# Patient Record
Sex: Female | Born: 1980 | Race: White | Hispanic: No | Marital: Married | Smoking: Never smoker
Health system: Southern US, Community
[De-identification: ages and names within clinical notes are randomized; demographics above are authoritative.]

## PROBLEM LIST (undated history)

## (undated) DIAGNOSIS — S73199A Other sprain of unspecified hip, initial encounter: Secondary | ICD-10-CM

## (undated) DIAGNOSIS — Z8041 Family history of malignant neoplasm of ovary: Secondary | ICD-10-CM

## (undated) HISTORY — PX: CHOLECYSTECTOMY: SHX55

---

## 1998-10-28 HISTORY — PX: CHOLECYSTECTOMY: SHX55

## 1998-11-01 ENCOUNTER — Ambulatory Visit (HOSPITAL_COMMUNITY): Admission: RE | Admit: 1998-11-01 | Discharge: 1998-11-01 | Payer: Self-pay | Admitting: General Surgery

## 2000-03-25 ENCOUNTER — Other Ambulatory Visit: Admission: RE | Admit: 2000-03-25 | Discharge: 2000-03-25 | Payer: Self-pay | Admitting: Obstetrics and Gynecology

## 2000-07-03 ENCOUNTER — Other Ambulatory Visit: Admission: RE | Admit: 2000-07-03 | Discharge: 2000-07-03 | Payer: Self-pay | Admitting: Obstetrics and Gynecology

## 2001-03-06 ENCOUNTER — Other Ambulatory Visit: Admission: RE | Admit: 2001-03-06 | Discharge: 2001-03-06 | Payer: Self-pay | Admitting: Obstetrics and Gynecology

## 2001-04-08 ENCOUNTER — Encounter: Payer: Self-pay | Admitting: Obstetrics and Gynecology

## 2001-04-08 ENCOUNTER — Ambulatory Visit (HOSPITAL_COMMUNITY): Admission: RE | Admit: 2001-04-08 | Discharge: 2001-04-08 | Payer: Self-pay | Admitting: Obstetrics and Gynecology

## 2001-06-15 ENCOUNTER — Inpatient Hospital Stay (HOSPITAL_COMMUNITY): Admission: AD | Admit: 2001-06-15 | Discharge: 2001-06-15 | Payer: Self-pay | Admitting: Obstetrics and Gynecology

## 2001-09-03 ENCOUNTER — Inpatient Hospital Stay (HOSPITAL_COMMUNITY): Admission: AD | Admit: 2001-09-03 | Discharge: 2001-09-05 | Payer: Self-pay | Admitting: Obstetrics and Gynecology

## 2001-09-23 ENCOUNTER — Encounter: Admission: RE | Admit: 2001-09-23 | Discharge: 2001-10-23 | Payer: Self-pay | Admitting: Obstetrics and Gynecology

## 2002-04-06 ENCOUNTER — Other Ambulatory Visit: Admission: RE | Admit: 2002-04-06 | Discharge: 2002-04-06 | Payer: Self-pay | Admitting: Obstetrics and Gynecology

## 2003-05-20 ENCOUNTER — Other Ambulatory Visit: Admission: RE | Admit: 2003-05-20 | Discharge: 2003-05-20 | Payer: Self-pay | Admitting: Obstetrics and Gynecology

## 2004-01-24 ENCOUNTER — Inpatient Hospital Stay (HOSPITAL_COMMUNITY): Admission: AD | Admit: 2004-01-24 | Discharge: 2004-01-24 | Payer: Self-pay | Admitting: Obstetrics and Gynecology

## 2004-04-23 ENCOUNTER — Inpatient Hospital Stay (HOSPITAL_COMMUNITY): Admission: AD | Admit: 2004-04-23 | Discharge: 2004-04-25 | Payer: Self-pay | Admitting: Obstetrics and Gynecology

## 2004-06-04 ENCOUNTER — Other Ambulatory Visit: Admission: RE | Admit: 2004-06-04 | Discharge: 2004-06-04 | Payer: Self-pay | Admitting: Obstetrics and Gynecology

## 2005-07-15 ENCOUNTER — Other Ambulatory Visit: Admission: RE | Admit: 2005-07-15 | Discharge: 2005-07-15 | Payer: Self-pay | Admitting: Obstetrics and Gynecology

## 2006-07-23 ENCOUNTER — Other Ambulatory Visit: Admission: RE | Admit: 2006-07-23 | Discharge: 2006-07-23 | Payer: Self-pay | Admitting: Obstetrics and Gynecology

## 2010-08-10 ENCOUNTER — Ambulatory Visit (HOSPITAL_COMMUNITY): Admission: RE | Admit: 2010-08-10 | Discharge: 2010-08-10 | Payer: Self-pay | Admitting: Obstetrics and Gynecology

## 2010-08-10 HISTORY — PX: HYSTEROSCOPY WITH NOVASURE: SHX5574

## 2011-01-09 LAB — CBC
HCT: 39 % (ref 36.0–46.0)
Hemoglobin: 13.3 g/dL (ref 12.0–15.0)
MCH: 29.1 pg (ref 26.0–34.0)
MCHC: 34.2 g/dL (ref 30.0–36.0)
MCV: 85 fL (ref 78.0–100.0)
Platelets: 223 10*3/uL (ref 150–400)
RBC: 4.59 MIL/uL (ref 3.87–5.11)
RDW: 12.8 % (ref 11.5–15.5)
WBC: 5.7 10*3/uL (ref 4.0–10.5)

## 2011-03-15 NOTE — Discharge Summary (Signed)
NAMEALDINE, CHAKRABORTY                          ACCOUNT NO.:  [id number]   MEDICAL RECORD NO.:  [id number]                   PATIENT TYPE:  INP   LOCATION:  9142                                 FACILITY:  [facility]   PHYSICIAN:  Huel Cote, M.D.              DATE OF BIRTH:  03-22-1981   DATE OF ADMISSION:  04/23/2004  DATE OF DISCHARGE:  04/25/2004                                 DISCHARGE SUMMARY   DISCHARGE DIAGNOSES:  1. Term pregnancy at 39+ weeks, delivered.  2. Status post normal spontaneous vaginal delivery.   DISCHARGE FOLLOWUP:  The patient is to follow up in the office in  approximately six weeks for her routine postpartum exam.   HISTORY OF PRESENT ILLNESS:  The patient is a 30 year old, G2, P1-0-0-1, who  was admitted at 39-5/[redacted] weeks gestation for induction of labor given term  status and a favorable cervix.  Prenatal care was uncomplicated.  Prenatal  laboratories were as follows:  B negative, antibody negative, RPR  nonreactive, rubella immune, hepatitis B surface antigen negative, HIV  declined, GC negative, chlamydia negative, triple screen normal, and group B  Streptococcus negative.   PAST OBSTETRICAL HISTORY:  In 2002, she had a vaginal delivery of a 7 pound  15 ounce infant.   PAST GYNECOLOGICAL HISTORY:  She had an ascus Pap smear in May of 2001.   PAST MEDICAL HISTORY:  None.   PAST SURGICAL HISTORY:  Cholecystectomy in 2000.   ALLERGIES:  None.   MEDICATIONS:  None.   PHYSICAL EXAMINATION:  VITAL SIGNS:  On admission, she was afebrile with  stable vital signs.  CARDIAC:  Regular rate and rhythm.  LUNGS:  Clear.  PELVIC:  The cervix was 70% effaced, 3+ cm, and a -1 station.   HOSPITAL COURSE:  She had rupture of membranes performed with clear fluid  obtained.  She was placed on Pitocin for low-dose protocol.  She continued  to progress well throughout the afternoon.  She reached complete dilation  and pushed with a normal spontaneous vaginal  delivery of a viable female  infant over a small first degree perineal laceration.  Apgars were 7 and 9.  Weight was 8 pounds 1 ounce.  The placenta was delivered  spontaneously.  There was a nuchal cord x 1 which was delivered through.  A  small laceration was repaired with 2-0 Vicryl.  She was then admitted for  routine postpartum care and did very well.  On postpartum day #2, she was  having minimal pain with Motrin and was felt stable or discharge home.                                               Huel Cote, M.D.    KR/MEDQ  D:  05/10/2004  T:  05/10/2004  Job:  [id number]

## 2011-03-15 NOTE — Discharge Summary (Signed)
[facility]  Patient:    Elizabeth Rose, Elizabeth Rose Visit Number: [id number] MRN: [id number]          Service Type: OBS Location: 910A 9140 01 Attending Physician:  Oliver Pila Dictated by:   Malachi Pro. Ambrose Mantle, M.D. Admit Date:  09/03/2001 Discharge Date: 09/05/2001                             Discharge Summary  HISTORY OF PRESENT ILLNESS:   This is a 30 year old, white female, P0, G1 at 39-5/[redacted] weeks gestation with [facility] of September 06, 2001, who presented with favorable cervix for induction of labor.  PRENATAL LABORATORY DATA:     Blood type B negative with negative antibody. RPR nonreactive. Rubella immune.  Hepatitis B surface antigen negative.  HIV negative.  GC and chlamydia negative.  Group B Streptococcus negative. One-hour glucola was 81.  PAST OBSTETRIC HISTORY:       Negative.  PAST GYNECOLOGIC HISTORY:     History of ASCUS on Pap smear in May 2001.  PAST SURGICAL HISTORY:        Cholecystectomy in 2000.  PAST MEDICAL HISTORY:         Negative.  FAMILY HISTORY:               Negative.  HOSPITAL COURSE:              The patient had normal vital signs on admission. Fetal heart tones reactive.  Cervix was 2-3 cm, 50% vertex and -2.  Artificial rupture of the membranes produced clear fluid.  By 12:45 p.m., the cervix was 3-4 cm, 70% vertex and -1.  By 5:40 p.m., the cervix was 4-5 cm.  Fetal heart tones were normal.  Intrauterine pressure catheter was placed to increase Pitocin.  By 10:30 p.m., the cervix was 8 cm.  Fetal heart tones showed early decelerations and excellent variability.  IUPC was replaced.  The patient reached complete dilatation and pushed well with a spontaneous vaginal delivery of a vigorous female infant over a first-degree perineal laceration by Dr. Senaida Ores.  The infant weighed 7 pounds 15 ounces.  Apgars were 9 and 9 at one and five minutes.  The placenta was delivered spontaneously.  There was mild uterine apnea  responded to manual massage and Pitocin.  Blood loss about 450 cc.  Postpartum, the patient did well and requested discharge on the first postpartum day.  The baby was negative, so the patient was not a candidate for RhoGAM.  Hemoglobin was 12.4, hematocrit 35.8, white count 8000, platelet count 233,000 and RPR nonreactive.  DISCHARGE DIAGNOSIS:          Intrauterine pregnancy at 39 weeks, delivered vertex.  PROCEDURE:                    Spontaneous vaginal delivery.  CONDITION ON DISCHARGE:       Improved.  SPECIAL INSTRUCTIONS:         Regular discharge instruction booklet.  Call with fever above 100 degrees.  Call with any unusual problems or heavy bleeding.  The patient declines analgesics at discharge.  FOLLOWUP:                     The patient is advised to return in six weeks for followup examination.  ACTIVITY:  No vaginal entrance, no heavy lifting. Dictated by:   Malachi Pro. Ambrose Mantle, M.D. Attending Physician:  Oliver Pila DD:  09/05/01 TD:  09/07/01 Job: [id number]

## 2012-11-13 ENCOUNTER — Other Ambulatory Visit: Payer: Self-pay | Admitting: Obstetrics and Gynecology

## 2012-11-13 DIAGNOSIS — N644 Mastodynia: Secondary | ICD-10-CM

## 2012-12-04 ENCOUNTER — Ambulatory Visit
Admission: RE | Admit: 2012-12-04 | Discharge: 2012-12-04 | Disposition: A | Discharge status: Home / Self Care | Payer: BC Managed Care – PPO | Source: Ambulatory Visit | Attending: Obstetrics and Gynecology | Admitting: Obstetrics and Gynecology

## 2012-12-04 DIAGNOSIS — N644 Mastodynia: Secondary | ICD-10-CM

## 2018-06-05 DIAGNOSIS — Z8041 Family history of malignant neoplasm of ovary: Secondary | ICD-10-CM | POA: Insufficient documentation

## 2019-03-11 ENCOUNTER — Ambulatory Visit (HOSPITAL_COMMUNITY)
Admission: EM | Admit: 2019-03-11 | Discharge: 2019-03-11 | Disposition: A | Discharge status: Home / Self Care | Payer: BLUE CROSS/BLUE SHIELD | Attending: Family Medicine | Admitting: Family Medicine

## 2019-03-11 ENCOUNTER — Ambulatory Visit (HOSPITAL_BASED_OUTPATIENT_CLINIC_OR_DEPARTMENT_OTHER)
Admit: 2019-03-11 | Discharge: 2019-03-11 | Disposition: A | Discharge status: Home / Self Care | Payer: BLUE CROSS/BLUE SHIELD | Attending: Family Medicine | Admitting: Family Medicine

## 2019-03-11 ENCOUNTER — Other Ambulatory Visit: Payer: Self-pay

## 2019-03-11 ENCOUNTER — Encounter (HOSPITAL_COMMUNITY): Payer: Self-pay

## 2019-03-11 DIAGNOSIS — M79661 Pain in right lower leg: Secondary | ICD-10-CM | POA: Diagnosis present

## 2019-03-11 DIAGNOSIS — M79609 Pain in unspecified limb: Secondary | ICD-10-CM

## 2019-03-11 NOTE — ED Triage Notes (Signed)
Pt present right leg/calf pain.  Pt states the pain started a few weeks ago.  Symptoms are tightness and cramping in her left lower calf.

## 2019-03-11 NOTE — Progress Notes (Signed)
RLE venous duplex negative for DVT       Preliminary results can be found under CV proc through chart review. Modean Mccullum, BS, RDMS, RVT   

## 2019-03-11 NOTE — ED Notes (Signed)
Called donna for appt.  Patient is scheduled at [facility] today at 2:00pm.

## 2019-03-11 NOTE — Discharge Instructions (Signed)
03/11/2019  2:00 PM  at  [facility]

## 2019-03-16 NOTE — ED Provider Notes (Signed)
[facility]   [id number] 03/11/19 Arrival Time: 1030  ASSESSMENT & PLAN:  1. Right calf pain    Discussed very low suspicion of this being a blood clot. She is aware but requests U/S.  I have personally viewed the imaging studies ordered this visit. U/S negative for DVT. Pt informed. May f/u with PCP as needed.  Reviewed expectations re: course of current medical issues. Questions answered. Outlined signs and symptoms indicating need for more acute intervention. Patient verbalized understanding. After Visit Summary given.  SUBJECTIVE: History from: patient. Elizabeth Rose is a 38 y.o. female who reports intermittent discomfort/pain of her right calf. Questions swelling at times. "Cramping feeling". No specific aggravating or alleviating factors reported. No injury/trauma reported. No CP/SOB reported. No h/o similar. She is very worried this could be a blood clot. Desires ultrasound. Ambulatory without difficulty. Unsure exactly when symptoms first noticed. No LMP recorded. Patient has had an ablation. No OCP use. Social History   Tobacco Use  Smoking Status Never Smoker  Smokeless Tobacco Never Used  No specific aggravating or alleviating factors reported. No home treatment. No extremity sensation changes or weakness.   History reviewed. No pertinent surgical history.   ROS: As per HPI. All other systems negative.    OBJECTIVE:  Vitals:   03/11/19 1056  BP: (!) 135/94  Pulse: 69  Resp: 16  Temp: 98.5 F (36.9 C)  TempSrc: Oral  SpO2: 100%    General appearance: alert; no distress HEENT: [place]; AT Neck: supple with FROM Lungs: CTAB CV: RRR Extremities: . RLE: warm and well perfused; poorly localized mild tenderness over right calf; without gross deformities; with without significant swelling when compared to her left calf; with no bruising; ROM: normal without reported discomfort CV: brisk extremity capillary refill of RLE; 2+ DP and PT pulse of RLE.  Skin: warm and dry; no visible rashes Neurologic: gait normal; normal reflexes of RLE and LLE; normal sensation of RLE and LLE; normal strength of RLE and LLE Psychological: alert and cooperative; normal mood and affect  No Known Allergies  PMH: No h/o blood clots.  Social History   Socioeconomic History  . Marital status: Married    Spouse name: Not on file  . Number of children: Not on file  . Years of education: Not on file  . Highest education level: Not on file  Occupational History  . Not on file  Social Needs  . Financial resource strain: Not on file  . Food insecurity:    Worry: Not on file    Inability: Not on file  . Transportation needs:    Medical: Not on file    Non-medical: Not on file  Tobacco Use  . Smoking status: Never Smoker  . Smokeless tobacco: Never Used  Substance and Sexual Activity  . Alcohol use: Not on file  . Drug use: Not on file  . Sexual activity: Not on file  Lifestyle  . Physical activity:    Days per week: Not on file    Minutes per session: Not on file  . Stress: Not on file  Relationships  . Social connections:    Talks on phone: Not on file    Gets together: Not on file    Attends religious service: Not on file    Active member of club or organization: Not on file    Attends meetings of clubs or organizations: Not on file    Relationship status: Not on file  Other Topics Concern  .  Not on file  Social History Narrative  . Not on file   Family History  Problem Relation Age of Onset  . Cancer Mother   . Heart failure Father    History reviewed. No pertinent surgical history.    Mardella LaymanHagler, Lola Lofaro, MD 03/24/19 [phone]

## 2020-04-24 ENCOUNTER — Ambulatory Visit: Payer: BC Managed Care – PPO

## 2020-05-11 ENCOUNTER — Ambulatory Visit: Payer: BC Managed Care – PPO | Attending: Internal Medicine

## 2020-05-11 DIAGNOSIS — Z23 Encounter for immunization: Secondary | ICD-10-CM

## 2020-05-11 NOTE — Progress Notes (Signed)
° °  Covid-19 Vaccination Clinic  Name:  Elizabeth Rose    MRN: [id number] DOB: [birth year]  05/11/2020  [name] was observed post Covid-19 immunization for 15 minutes without incident. She was provided with Vaccine Information Sheet and instruction to access the V-Safe system.   [name] was instructed to call 911 with any severe reactions post vaccine:  Difficulty breathing   Swelling of face and throat   A fast heartbeat   A bad rash all over body   Dizziness and weakness   Immunizations Administered    Name Date Dose VIS Date Route   Pfizer COVID-19 Vaccine 05/11/2020  4:32 PM 0.3 mL 12/22/2018 Intramuscular   Manufacturer: [employer], [employer]   Lot: [id number]   NDC: [address]-0

## 2021-06-02 ENCOUNTER — Encounter (HOSPITAL_COMMUNITY): Payer: Self-pay | Admitting: Emergency Medicine

## 2021-06-02 ENCOUNTER — Other Ambulatory Visit: Payer: Self-pay

## 2021-06-02 ENCOUNTER — Emergency Department (HOSPITAL_COMMUNITY): Payer: No Typology Code available for payment source

## 2021-06-02 ENCOUNTER — Observation Stay (HOSPITAL_COMMUNITY): Payer: No Typology Code available for payment source

## 2021-06-02 ENCOUNTER — Observation Stay (HOSPITAL_COMMUNITY)
Admission: EM | Admit: 2021-06-02 | Discharge: 2021-06-03 | Disposition: A | Discharge status: Home / Self Care | Payer: No Typology Code available for payment source | Attending: Family Medicine | Admitting: Family Medicine

## 2021-06-02 DIAGNOSIS — R945 Abnormal results of liver function studies: Secondary | ICD-10-CM | POA: Insufficient documentation

## 2021-06-02 DIAGNOSIS — U071 COVID-19: Principal | ICD-10-CM | POA: Diagnosis present

## 2021-06-02 DIAGNOSIS — R55 Syncope and collapse: Secondary | ICD-10-CM | POA: Insufficient documentation

## 2021-06-02 DIAGNOSIS — I951 Orthostatic hypotension: Secondary | ICD-10-CM | POA: Diagnosis not present

## 2021-06-02 DIAGNOSIS — R7989 Other specified abnormal findings of blood chemistry: Secondary | ICD-10-CM

## 2021-06-02 DIAGNOSIS — R059 Cough, unspecified: Secondary | ICD-10-CM | POA: Diagnosis present

## 2021-06-02 DIAGNOSIS — I959 Hypotension, unspecified: Secondary | ICD-10-CM | POA: Diagnosis present

## 2021-06-02 LAB — CBC WITH DIFFERENTIAL/PLATELET
Abs Immature Granulocytes: 0.02 10*3/uL (ref 0.00–0.07)
Basophils Absolute: 0 10*3/uL (ref 0.0–0.1)
Basophils Relative: 0 %
Eosinophils Absolute: 0 10*3/uL (ref 0.0–0.5)
Eosinophils Relative: 0 %
HCT: 42.7 % (ref 36.0–46.0)
Hemoglobin: 14 g/dL (ref 12.0–15.0)
Immature Granulocytes: 0 %
Lymphocytes Relative: 13 %
Lymphs Abs: 0.8 10*3/uL (ref 0.7–4.0)
MCH: 29.1 pg (ref 26.0–34.0)
MCHC: 32.8 g/dL (ref 30.0–36.0)
MCV: 88.8 fL (ref 80.0–100.0)
Monocytes Absolute: 0.8 10*3/uL (ref 0.1–1.0)
Monocytes Relative: 12 %
Neutro Abs: 4.8 10*3/uL (ref 1.7–7.7)
Neutrophils Relative %: 75 %
Platelets: 181 10*3/uL (ref 150–400)
RBC: 4.81 MIL/uL (ref 3.87–5.11)
RDW: 12.2 % (ref 11.5–15.5)
WBC: 6.4 10*3/uL (ref 4.0–10.5)
nRBC: 0 % (ref 0.0–0.2)

## 2021-06-02 LAB — COMPREHENSIVE METABOLIC PANEL
ALT: 85 U/L — ABNORMAL HIGH (ref 0–44)
AST: 117 U/L — ABNORMAL HIGH (ref 15–41)
Albumin: 4 g/dL (ref 3.5–5.0)
Alkaline Phosphatase: 73 U/L (ref 38–126)
Anion gap: 8 (ref 5–15)
BUN: 7 mg/dL (ref 6–20)
CO2: 26 mmol/L (ref 22–32)
Calcium: 8.6 mg/dL — ABNORMAL LOW (ref 8.9–10.3)
Chloride: 102 mmol/L (ref 98–111)
Creatinine, Ser: 0.85 mg/dL (ref 0.44–1.00)
GFR, Estimated: >60 mL/min (ref >60)
Glucose, Bld: 124 mg/dL — ABNORMAL HIGH (ref 70–99)
Potassium: 4.6 mmol/L (ref 3.5–5.1)
Sodium: 136 mmol/L (ref 135–145)
Total Bilirubin: 0.4 mg/dL (ref 0.3–1.2)
Total Protein: 6.9 g/dL (ref 6.5–8.1)

## 2021-06-02 LAB — URINALYSIS, ROUTINE W REFLEX MICROSCOPIC
Bilirubin Urine: NEGATIVE
Glucose, UA: NEGATIVE mg/dL
Hgb urine dipstick: NEGATIVE
Ketones, ur: 20 mg/dL — AB
Leukocytes,Ua: NEGATIVE
Nitrite: NEGATIVE
Protein, ur: NEGATIVE mg/dL
Specific Gravity, Urine: 1.013 (ref 1.005–1.030)
pH: 5 (ref 5.0–8.0)

## 2021-06-02 LAB — D-DIMER, QUANTITATIVE: D-Dimer, Quant: 0.27 ug/mL-FEU (ref 0.00–0.50)

## 2021-06-02 LAB — LACTIC ACID, PLASMA
Lactic Acid, Venous: 1.2 mmol/L (ref 0.5–1.9)
Lactic Acid, Venous: 1.3 mmol/L (ref 0.5–1.9)

## 2021-06-02 LAB — FERRITIN: Ferritin: 39 ng/mL (ref 11–307)

## 2021-06-02 LAB — CBG MONITORING, ED: Glucose-Capillary: 117 mg/dL — ABNORMAL HIGH (ref 70–99)

## 2021-06-02 LAB — C-REACTIVE PROTEIN: CRP: 0.6 mg/dL (ref <1.0)

## 2021-06-02 LAB — ACETAMINOPHEN LEVEL: Acetaminophen (Tylenol), Serum: <10 ug/mL — ABNORMAL LOW (ref 10–30)

## 2021-06-02 MED ORDER — MENTHOL 3 MG MT LOZG
1.0000 | LOZENGE | OROMUCOSAL | Status: DC | PRN
  Filled 2021-06-02: qty 9

## 2021-06-02 MED ORDER — ZINC SULFATE 220 (50 ZN) MG PO CAPS
220.0000 mg | ORAL_CAPSULE | Freq: Every day | ORAL | Status: DC
  Administered 2021-06-02 – 2021-06-03 (×2): 220 mg via ORAL
  Filled 2021-06-02 (×2): qty 1

## 2021-06-02 MED ORDER — SODIUM CHLORIDE 0.9 % IV BOLUS
1000.0000 mL | Freq: Once | INTRAVENOUS | Status: AC
  Administered 2021-06-02: 1000 mL via INTRAVENOUS

## 2021-06-02 MED ORDER — LACTATED RINGERS IV BOLUS
500.0000 mL | Freq: Once | INTRAVENOUS | Status: AC
  Administered 2021-06-02: 500 mL via INTRAVENOUS

## 2021-06-02 MED ORDER — ONDANSETRON HCL 4 MG/2ML IJ SOLN
4.0000 mg | Freq: Four times a day (QID) | INTRAMUSCULAR | Status: DC | PRN
  Administered 2021-06-02: 4 mg via INTRAVENOUS
  Filled 2021-06-02: qty 2

## 2021-06-02 MED ORDER — NIRMATRELVIR/RITONAVIR (PAXLOVID)TABLET
3.0000 | ORAL_TABLET | Freq: Two times a day (BID) | ORAL | Status: DC
  Administered 2021-06-02 – 2021-06-03 (×3): 3 via ORAL
  Filled 2021-06-02 (×2): qty 30

## 2021-06-02 MED ORDER — ASCORBIC ACID 500 MG PO TABS
500.0000 mg | ORAL_TABLET | Freq: Every day | ORAL | Status: DC
  Administered 2021-06-02 – 2021-06-03 (×2): 500 mg via ORAL
  Filled 2021-06-02 (×2): qty 1

## 2021-06-02 MED ORDER — ACETAMINOPHEN 325 MG PO TABS: 650.0000 mg | ORAL_TABLET | Freq: Once | ORAL | Status: DC

## 2021-06-02 MED ORDER — SODIUM CHLORIDE 0.9 % IV SOLN: INTRAVENOUS | Status: DC

## 2021-06-02 MED ORDER — ONDANSETRON HCL 4 MG PO TABS
4.0000 mg | ORAL_TABLET | Freq: Four times a day (QID) | ORAL | Status: DC | PRN
  Administered 2021-06-03: 4 mg via ORAL
  Filled 2021-06-02: qty 1

## 2021-06-02 MED ORDER — SODIUM CHLORIDE 0.9% FLUSH
3.0000 mL | Freq: Two times a day (BID) | INTRAVENOUS | Status: DC
  Administered 2021-06-02 – 2021-06-03 (×2): 3 mL via INTRAVENOUS

## 2021-06-02 MED ORDER — GUAIFENESIN-DM 100-10 MG/5ML PO SYRP: 10.0000 mL | ORAL_SOLUTION | ORAL | Status: DC | PRN

## 2021-06-02 MED ORDER — ENOXAPARIN SODIUM 40 MG/0.4ML IJ SOSY
40.0000 mg | PREFILLED_SYRINGE | INTRAMUSCULAR | Status: DC
  Administered 2021-06-02: 40 mg via SUBCUTANEOUS

## 2021-06-02 MED ORDER — HYDROCOD POLST-CPM POLST ER 10-8 MG/5ML PO SUER
5.0000 mL | Freq: Two times a day (BID) | ORAL | Status: DC | PRN
Start: 2021-06-02 — End: 2021-06-03
  Administered 2021-06-02: 5 mL via ORAL
  Filled 2021-06-02: qty 5

## 2021-06-02 NOTE — ED Notes (Signed)
PT unable to due ortho vitals at this time due to n/v

## 2021-06-02 NOTE — ED Provider Notes (Signed)
[facility]-EMERGENCY DEPT Provider Note   CSN: [id number] Arrival date & time: 06/02/21  0443     History Chief Complaint  Patient presents with   Covid Positive    Elizabeth Rose is a 40 y.o. female.  Patient presents to the emergency department with a chief complaint of syncope.  She states that she took a home COVID test which was positive earlier yesterday.  States that she stood up to take some medicine this morning, and passed out.  She complains of sore throat and slight cough.  She denies chest pain or shortness of breath.  She is noted to be hypotensive and bradycardic and hypothermic in triage.  The history is provided by the patient. No language interpreter was used.      History reviewed. No pertinent past medical history.  There are no problems to display for this patient.   History reviewed. No pertinent surgical history.   OB History   No obstetric history on file.     Family History  Problem Relation Age of Onset   Cancer Mother    Heart failure Father     Social History   Tobacco Use   Smoking status: Never   Smokeless tobacco: Never    Home Medications Prior to Admission medications   Not on File    Allergies    Patient has no known allergies.  Review of Systems   Review of Systems  All other systems reviewed and are negative.  Physical Exam Updated Vital Signs BP (!) 89/51 (BP Location: Left Arm)   Pulse (!) 46   Temp (!) 94.5 F (34.7 C) (Axillary)   Resp 20   SpO2 97%   Physical Exam Vitals and nursing note reviewed.  Constitutional:      General: She is not in acute distress.    Appearance: She is well-developed.  HENT:     Head: Normocephalic and atraumatic.  Eyes:     Conjunctiva/sclera: Conjunctivae normal.  Cardiovascular:     Rate and Rhythm: Normal rate and regular rhythm.     Heart sounds: No murmur heard. Pulmonary:     Effort: Pulmonary effort is normal. No respiratory distress.      Breath sounds: Normal breath sounds.  Abdominal:     Palpations: Abdomen is soft.     Tenderness: There is no abdominal tenderness.  Musculoskeletal:        General: Normal range of motion.     Cervical back: Neck supple.  Skin:    General: Skin is warm and dry.  Neurological:     Mental Status: She is alert and oriented to person, place, and time.  Psychiatric:     Comments: flat    ED Results / Procedures / Treatments   Labs (all labs ordered are listed, but only abnormal results are displayed) Labs Reviewed  CBC WITH DIFFERENTIAL/PLATELET  COMPREHENSIVE METABOLIC PANEL  URINALYSIS, ROUTINE W REFLEX MICROSCOPIC    EKG None  Radiology No results found.  Procedures Procedures   Medications Ordered in ED Medications  sodium chloride 0.9 % bolus 1,000 mL (has no administration in time range)  sodium chloride 0.9 % bolus 1,000 mL (has no administration in time range)    ED Course  I have reviewed the triage vital signs and the nursing notes.  Pertinent labs & imaging results that were available during my care of the patient were reviewed by me and considered in my medical decision making (see chart for details).  MDM Rules/Calculators/A&P                           Elizabeth Rose was evaluated in Emergency Department on 06/02/2021 for the symptoms described in the history of present illness. She was evaluated in the context of the global COVID-19 pandemic, which necessitated consideration that the patient might be at risk for infection with the SARS-CoV-2 virus that causes COVID-19. Institutional protocols and algorithms that pertain to the evaluation of patients at risk for COVID-19 are in a state of rapid change based on information released by regulatory bodies including the CDC and federal and state organizations. These policies and algorithms were followed during the patient's care in the ED.  Patient had axillary temperature of 94.5 in triage, rectal temperature  was checked and is 98.7.  Patient noted to be hypotensive at 89/51 with a MAP of 63 in triage.  Will give fluids.  Patient also noted to be bradycardic at 46.  EKG shows sinus bradycardia at 53 bpm with sinus arrhythmia, otherwise no abnormalities seen.  Chest x-ray shows NO infiltrate.  CT head also ordered because of the fall.  CT shows no intracranial bleeding or abnormality.  Will give patient fluids and will reassess and check labs.  If blood pressures improve and patient stabilizes, anticipate the patient can be discharged home with an antiviral.  Patient signed out at shift change to oncoming team, who will continue care.  Plan: F/u on labs Reassess after fluids  Final Clinical Impression(s) / ED Diagnoses Final diagnoses:  COVID-19  Syncope, unspecified syncope type  Hypotension, unspecified hypotension type    Rx / DC Orders ED Discharge Orders     None        Roxy Horseman, PA-C 06/02/21 5625    Jacalyn Lefevre, MD 06/02/21 [phone]

## 2021-06-02 NOTE — Plan of Care (Signed)

## 2021-06-02 NOTE — H&P (Signed)
History and Physical    Mareesa Loeffelholz YBO:[id number] DOB: December 29, 1980 DOA: 06/02/2021  PCP: Patient, No Pcp Per (Inactive)  Patient coming from: Home  Chief Complaint: syncope  HPI: Elizabeth Rose is a 40 y.o. female with no significant past medical history. Presenting with a syncopal episode. History is from husband at bedside. He reports that at 4am this morning, he heard a "thump" in the kitchen. He found her on passed out on the kitchen floor. He was able to sit her up but she still seemed out of it. There was no tonic-clonic shaking or evidence of incontinence. He tried to give her sprite, but she passed out again. When she came to, she complained of being hot/flush and she appeared pale. He decided to bring her to the ED for help.  Of note, she's been suffering from poor PO intake for several days. She had a sore throat and body aches for several days w/ cough She had transient fever. She has tried APAP, ibuprofen, nyquil, advil cold & sinus, and hydrocodone. Nothing has helped. She has had no sick contacts. She denies any other aggravating or alleviating factors.    ED Course: She was found to be COVID+. She was found to be bradycardic and hypotensive. UA was unremarkable. She was started on fluids and paxlovid. TRH was called for admission.   Review of Systems:  Denies CP, dyspnea, palpitations, N/V/D. Review of systems is otherwise negative for all not mentioned in HPI.   PMHx Denies PMHx  PSHx Cholecystectomy  SocHx  reports that she has never smoked. She has never used smokeless tobacco. No history on file for alcohol use and drug use.  No Known Allergies  FamHx Family History  Problem Relation Age of Onset   Cancer Mother    Heart failure Father     Prior to Admission medications   Medication Sig Start Date End Date Taking? Authorizing Provider  lidocaine (XYLOCAINE) 2 % solution Use as directed 15 mLs in the mouth or throat See admin instructions. Use as directed 15  mLs in the mouth or throat every 3 hours 06/01/21  Yes [provider]    Physical Exam: Vitals:   06/02/21 1100 06/02/21 1115 06/02/21 1200 06/02/21 1215  BP: 106/67 113/63 105/71   Pulse: (!) 54 68 (!) 52 61  Resp: 15 11 14 12   Temp:      TempSrc:      SpO2: 99% 100% 99% 99%  Weight:      Height:        General: 40 y.o. female resting in bed in NAD Eyes: PERRL, normal sclera ENMT: Nares patent w/o discharge, orophaynx clear, dentition normal, ears w/o discharge/lesions/ulcers Neck: Supple, trachea midline Cardiovascular: brady, +S1, S2, no m/g/r, equal pulses throughout Respiratory: CTABL, no w/r/r, normal WOB GI: BS+, NDNT, no masses noted, no organomegaly noted MSK: No e/c/c Skin: No rashes, bruises, ulcerations noted Neuro: A&O x 3, no focal deficits Psyc: Appropriate interaction and affect, calm/cooperative  Labs on Admission: I have personally reviewed following labs and imaging studies  CBC: Recent Labs  Lab 06/02/21 0611  WBC 6.4  NEUTROABS 4.8  HGB 14.0  HCT 42.7  MCV 88.8  PLT 181   Basic Metabolic Panel: Recent Labs  Lab 06/02/21 0611  NA 136  K 4.6  CL 102  CO2 26  GLUCOSE 124*  BUN 7  CREATININE 0.85  CALCIUM 8.6*   GFR: Estimated Creatinine Clearance: 76.1 mL/min (by C-G formula based on SCr of  0.85 mg/dL). Liver Function Tests: Recent Labs  Lab 06/02/21 0611  AST 117*  ALT 85*  ALKPHOS 73  BILITOT 0.4  PROT 6.9  ALBUMIN 4.0   No results for input(s): LIPASE, AMYLASE in the last 168 hours. No results for input(s): AMMONIA in the last 168 hours. Coagulation Profile: No results for input(s): INR, PROTIME in the last 168 hours. Cardiac Enzymes: No results for input(s): CKTOTAL, CKMB, CKMBINDEX, TROPONINI in the last 168 hours. BNP (last 3 results) No results for input(s): PROBNP in the last 8760 hours. HbA1C: No results for input(s): HGBA1C in the last 72 hours. CBG: Recent Labs  Lab 06/02/21 0623  GLUCAP 117*    Lipid Profile: No results for input(s): CHOL, HDL, LDLCALC, TRIG, CHOLHDL, LDLDIRECT in the last 72 hours. Thyroid Function Tests: No results for input(s): TSH, T4TOTAL, FREET4, T3FREE, THYROIDAB in the last 72 hours. Anemia Panel: No results for input(s): VITAMINB12, FOLATE, FERRITIN, TIBC, IRON, RETICCTPCT in the last 72 hours. Urine analysis:    Component Value Date/Time   COLORURINE YELLOW 06/02/2021 1026   APPEARANCEUR CLEAR 06/02/2021 1026   LABSPEC 1.013 06/02/2021 1026   PHURINE 5.0 06/02/2021 1026   GLUCOSEU NEGATIVE 06/02/2021 1026   HGBUR NEGATIVE 06/02/2021 1026   BILIRUBINUR NEGATIVE 06/02/2021 1026   KETONESUR 20 (A) 06/02/2021 1026   PROTEINUR NEGATIVE 06/02/2021 1026   NITRITE NEGATIVE 06/02/2021 1026   LEUKOCYTESUR NEGATIVE 06/02/2021 1026    Radiological Exams on Admission: DG Chest 2 View  Result Date: 06/02/2021 CLINICAL DATA:  40 year old female with history of cough and syncope. EXAM: CHEST - 2 VIEW COMPARISON:  No priors. FINDINGS: Lung volumes are normal. No consolidative airspace disease. No pleural effusions. No pneumothorax. No pulmonary nodule or mass noted. Pulmonary vasculature and the cardiomediastinal silhouette are within normal limits. IMPRESSION: No radiographic evidence of acute cardiopulmonary disease. Electronically Signed   By: Trudie Reed M.D.   On: 06/02/2021 06:07   CT HEAD WO CONTRAST ( )  Result Date: 06/02/2021 CLINICAL DATA:  40 year old female with history of facial trauma. Hypotension. Bradycardia. EXAM: CT HEAD WITHOUT CONTRAST TECHNIQUE: Contiguous axial images were obtained from the base of the skull through the vertex without intravenous contrast. COMPARISON:  No priors. FINDINGS: Brain: No evidence of acute infarction, hemorrhage, hydrocephalus, extra-axial collection or mass lesion/mass effect. Vascular: No hyperdense vessel or unexpected calcification. Skull: Normal. Negative for fracture or focal lesion. Sinuses/Orbits: No  acute finding. Other: None. IMPRESSION: 1. No acute intracranial abnormalities. The appearance of the brain is normal. Electronically Signed   By: Trudie Reed M.D.   On: 06/02/2021 06:07    EKG: Independently reviewed. Sinus brady, no st elevation  Assessment/Plan Syncope     - place in obs, tele     - check orthostatics     - continue fluids for now     - d-dimer is negative     - check echo     - CTH and CXR are negative     - story doesn't sound like seizure, so will hold off on EEG for now     - could be secondary to bradycardia or COVID infection; let's treat COVID, monitor her on tele, if she doesn't improve, get cards consult for bradycardia  COVID 19     - no O2 requirement     - continue paxlovid, fluids; add anti-tussives     - follow inflammatory markers     - place in isolation     - she's had  2-part vaccine, but no booster  Bradycardia     - EKG w/ sinus brady; no sign of HB     - she's running HR between 40 - 70 during interview     - let's keep her on tele; treat COVID, if not improving, can speak with cards about her heart     - she will get an echo as part of the syncope w/u  Elevated LFTs     - check hepatitis panel and RUQ US  DVT prophylaxis: lovenox  Code Status: FULL  Family Communication: w/ husband at bedside  Consults called: None   Status is: Observation  The patient remains OBS appropriate and will d/c before 2 midnights.  Dispo: The patient is from: Home              Anticipated d/c is to: Home              Patient currently is not medically stable to d/c.   Difficult to place patient No  Time spent coordinating admission: 60 minutes  Katoya Amato A Faten Frieson DO [facility]  If 7PM-7AM, please contact night-coverage [email or web address]  06/02/2021, 12:55 PM

## 2021-06-02 NOTE — Discharge Instructions (Signed)
   Please continue isolation at home, for at least 5 days since the start of your symptoms and until you have had 24 hours with no fever (without taking a fever reducer) and with improving of symptoms.  If you have no symptoms but tested positive (or all symptoms resolve after 5 days and you have no fever) you can leave your house but continue to wear a mask around others for an additional 5 days. If you have a fever,continue to stay home until you have had 24 hours of no fever. Most cases improve 5-10 days from onset but we have seen a small number of patients who have gotten worse after the 10 days.  Please be sure to watch for worsening symptoms and remain taking the proper precautions.   Go to the nearest hospital ED for assessment if fever/cough/breathlessness are severe or illness seems like a threat to life.    The following symptoms may appear 2-14 days after exposure: Fever Cough Shortness of breath or difficulty breathing Chills Repeated shaking with chills Muscle pain Headache Sore throat New loss of taste or smell Fatigue Congestion or runny nose Nausea or vomiting Diarrhea  You may also take acetaminophen (Tylenol) as needed for fever.  HOME CARE: Only take medications as instructed by your medical team. Drink plenty of fluids and get plenty of rest. A steam or ultrasonic humidifier can help if you have congestion.   GET HELP RIGHT AWAY IF YOU HAVE EMERGENCY WARNING SIGNS.  Call 911 or proceed to your closest emergency facility if: You develop worsening high fever. Trouble breathing Bluish lips or face Persistent pain or pressure in the chest New confusion Inability to wake or stay awake You cough up blood. Your symptoms become more severe Inability to hold down food or fluids  This list is not all possible symptoms. Contact your medical provider for any symptoms that are severe or concerning to you.

## 2021-06-02 NOTE — ED Triage Notes (Signed)
Pt not answering questions in triage, but per husband and mother, patient was dx'd with COVID yesterday and has been getting progressively weaker. Hypotensive and bradycardic in triage.   Husband: Tawanna Cooler [phone]

## 2021-06-02 NOTE — ED Provider Notes (Signed)
  Physical Exam  BP 101/62   Pulse (!) 53   Temp 98.7 F (37.1 C) (Rectal)   Resp 16   Ht 5\' 1"  (1.549 m)   Wt 68 kg   SpO2 94%   BMI 28.34 kg/m   Physical Exam  ED Course/Procedures   Clinical Course as of 06/02/21 1304  Sat Jun 02, 2021  1243 Consult to hospitalist, Dr. Jun 04, 2021, who is agreeable to admitting this patient to his service.  Appreciate his collaboration in the care of this patient. [RS]    Clinical Course User Index [RS] Ronaldo Miyamoto    EKG Interpretation  Date/Time:  Saturday June 02 2021 05:22:38 EDT Ventricular Rate:  53 PR Interval:  136 QRS Duration: 78 QT Interval:  462 QTC Calculation: 433 R Axis:   68 Text Interpretation: Sinus bradycardia with sinus arrhythmia Otherwise normal ECG No old tracing to compare Confirmed by [id number] [phone]) on 06/02/2021 5:38:36 AM        Procedures  MDM   Care of this patient assumed from preceding ED provider 08/02/2021, PA-C at time of shift change.  In brief patient tested positive for COVID-19 yesterday at [facility] and presents after syncopal episode  this morning.  Concern for progressive weakness.  Patient was hypotensive and bradycardic in triage.  At time of shift change she is awaiting results for the majority of her laboratory studies.  She is also receiving first liter bolus of IV fluids.  Pending labs and improved BP and ambulation status following fluid resuscitation, may be discharged home.  However if she continues to be unable to ambulate or remains hypotensive may require admission for further stabilization.  CT head negative for acute intracranial abnormality, chest x-ray negative for acute cardiopulmonary disease.  EKG reassuring, sinus bradycardia with sinus arrhythmia with heart rate in the 50s.  CBC unremarkable, CMP with transaminitis with AST/ALT 117/85.  Total bilirubin is normal.  Lactic acid is normal, 1.3.    Patient reevaluated after administration of IV fluid  resuscitation.  Improvement in BP to systolic of 107, heart rate remains in the mid to high 50s.  Patient still appears ill however states she no longer feels near syncopal.  Further discussion regarding over-the-counter medications of the patient has been taking given findings of transaminitis.  Does appear patient is been taking significant amounts of Tylenol as well as hydrocodone mixed with Tylenol and over-the-counter cold medications containing Tylenol.  We will add on acetaminophen level to evaluate for acetaminophen toxicity and will attempt to ambulate the patient.  We will have low threshold to admit given patient remains ill-appearing, bradycardic, and borderline hypotensive after 2 liters of IV fluids.  Patient has received another liter of IV fluids remains borderline hypotensive.  Unable to ambulate independently.  Acetaminophen level is normal.  Given her poor functional status at this juncture do feel she would benefit from admission to the hospital for dehydration and weakness.  She is on any medications to contraindicate administration of packed fluid, will proceed with this treatment for her COVID-19 infection.  Consult to hospitalist as above. Loucille voiced understanding of her medical evaluation and treatment plan thus far.  Each of her questions was answered to her expressed satisfaction.  Return precautions given.  Patient is well-appearing, stable, appropriate for discharge at this time.    [facility], PA-C 06/02/21 1305    08/02/21, MD 06/02/21 [phone]

## 2021-06-02 NOTE — ED Notes (Signed)
Pt expressed dizziness with standing, pt's SpO2 levels maintained 100% Room Air with ambulation, pt required minimal assistance and maintained equal gait.

## 2021-06-02 NOTE — ED Notes (Signed)
Pt provided w/bedside commode. [employer]

## 2021-06-03 ENCOUNTER — Observation Stay (HOSPITAL_BASED_OUTPATIENT_CLINIC_OR_DEPARTMENT_OTHER): Payer: No Typology Code available for payment source

## 2021-06-03 DIAGNOSIS — R55 Syncope and collapse: Secondary | ICD-10-CM

## 2021-06-03 DIAGNOSIS — I951 Orthostatic hypotension: Secondary | ICD-10-CM | POA: Diagnosis present

## 2021-06-03 DIAGNOSIS — R7989 Other specified abnormal findings of blood chemistry: Secondary | ICD-10-CM | POA: Diagnosis present

## 2021-06-03 DIAGNOSIS — I959 Hypotension, unspecified: Secondary | ICD-10-CM | POA: Diagnosis present

## 2021-06-03 LAB — COMPREHENSIVE METABOLIC PANEL
ALT: 75 U/L — ABNORMAL HIGH (ref 0–44)
AST: 56 U/L — ABNORMAL HIGH (ref 15–41)
Albumin: 3.1 g/dL — ABNORMAL LOW (ref 3.5–5.0)
Alkaline Phosphatase: 57 U/L (ref 38–126)
Anion gap: 6 (ref 5–15)
BUN: 6 mg/dL (ref 6–20)
CO2: 24 mmol/L (ref 22–32)
Calcium: 7.7 mg/dL — ABNORMAL LOW (ref 8.9–10.3)
Chloride: 107 mmol/L (ref 98–111)
Creatinine, Ser: 0.61 mg/dL (ref 0.44–1.00)
GFR, Estimated: >60 mL/min (ref >60)
Glucose, Bld: 81 mg/dL (ref 70–99)
Potassium: 3.7 mmol/L (ref 3.5–5.1)
Sodium: 137 mmol/L (ref 135–145)
Total Bilirubin: 0.5 mg/dL (ref 0.3–1.2)
Total Protein: 5.7 g/dL — ABNORMAL LOW (ref 6.5–8.1)

## 2021-06-03 LAB — CBC WITH DIFFERENTIAL/PLATELET
Abs Immature Granulocytes: 0.01 10*3/uL (ref 0.00–0.07)
Basophils Absolute: 0 10*3/uL (ref 0.0–0.1)
Basophils Relative: 0 %
Eosinophils Absolute: 0 10*3/uL (ref 0.0–0.5)
Eosinophils Relative: 0 %
HCT: 36.3 % (ref 36.0–46.0)
Hemoglobin: 11.9 g/dL — ABNORMAL LOW (ref 12.0–15.0)
Immature Granulocytes: 0 %
Lymphocytes Relative: 34 %
Lymphs Abs: 1.4 10*3/uL (ref 0.7–4.0)
MCH: 28.7 pg (ref 26.0–34.0)
MCHC: 32.8 g/dL (ref 30.0–36.0)
MCV: 87.7 fL (ref 80.0–100.0)
Monocytes Absolute: 0.7 10*3/uL (ref 0.1–1.0)
Monocytes Relative: 17 %
Neutro Abs: 1.9 10*3/uL (ref 1.7–7.7)
Neutrophils Relative %: 49 %
Platelets: 178 10*3/uL (ref 150–400)
RBC: 4.14 MIL/uL (ref 3.87–5.11)
RDW: 12.1 % (ref 11.5–15.5)
WBC: 4 10*3/uL (ref 4.0–10.5)
nRBC: 0 % (ref 0.0–0.2)

## 2021-06-03 LAB — ECHOCARDIOGRAM COMPLETE
AR max vel: 2.18 cm2
AV Area VTI: 1.99 cm2
AV Area mean vel: 1.96 cm2
AV Mean grad: 3 mmHg
AV Peak grad: 6.4 mmHg
Ao pk vel: 1.26 m/s
Area-P 1/2: 3.77 cm2
Calc EF: 64 %
Height: 61 in
S' Lateral: 2.9 cm
Single Plane A2C EF: 66.2 %
Single Plane A4C EF: 62.1 %
Weight: 2398.6 oz

## 2021-06-03 LAB — D-DIMER, QUANTITATIVE: D-Dimer, Quant: <0.27 ug/mL-FEU (ref 0.00–0.50)

## 2021-06-03 LAB — HEPATITIS PANEL, ACUTE
HCV Ab: NONREACTIVE
Hep A IgM: NONREACTIVE
Hep B C IgM: NONREACTIVE
Hepatitis B Surface Ag: NONREACTIVE

## 2021-06-03 LAB — GLUCOSE, CAPILLARY: Glucose-Capillary: 84 mg/dL (ref 70–99)

## 2021-06-03 LAB — FERRITIN: Ferritin: 36 ng/mL (ref 11–307)

## 2021-06-03 LAB — HIV ANTIBODY (ROUTINE TESTING W REFLEX): HIV Screen 4th Generation wRfx: NONREACTIVE

## 2021-06-03 LAB — C-REACTIVE PROTEIN: CRP: 0.6 mg/dL (ref <1.0)

## 2021-06-03 MED ORDER — NIRMATRELVIR/RITONAVIR (PAXLOVID)TABLET: 3.0000 | ORAL_TABLET | Freq: Two times a day (BID) | ORAL | 0 refills | Status: AC
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
## 2021-06-03 NOTE — Progress Notes (Signed)
*  PRELIMINARY RESULTS* Echocardiogram 2D Echocardiogram has been performed.  Neomia Dear RDCS 06/03/2021, 8:30 AM

## 2021-06-03 NOTE — Progress Notes (Signed)
AVS given to patient and explained at the bedside. Medications and follow up appointments have been explained with pt verbalizing understanding.  

## 2021-08-07 ENCOUNTER — Other Ambulatory Visit (HOSPITAL_BASED_OUTPATIENT_CLINIC_OR_DEPARTMENT_OTHER): Payer: Self-pay

## 2021-08-07 MED ORDER — INFLUENZA VAC SPLIT QUAD 0.5 ML IM SUSY
PREFILLED_SYRINGE | INTRAMUSCULAR | 0 refills | Status: DC
  Filled 2021-08-07: qty 0.5, 1d supply, fill #0

## 2021-11-02 ENCOUNTER — Encounter: Payer: Self-pay | Admitting: Internal Medicine

## 2021-11-02 ENCOUNTER — Other Ambulatory Visit: Payer: Self-pay

## 2021-11-02 ENCOUNTER — Ambulatory Visit: Payer: No Typology Code available for payment source | Admitting: Internal Medicine

## 2021-11-02 ENCOUNTER — Ambulatory Visit (INDEPENDENT_AMBULATORY_CARE_PROVIDER_SITE_OTHER): Payer: No Typology Code available for payment source

## 2021-11-02 VITALS — BP 122/70 | HR 70 | Resp 18 | Ht 62.5 in | Wt 159.2 lb

## 2021-11-02 DIAGNOSIS — R7989 Other specified abnormal findings of blood chemistry: Secondary | ICD-10-CM

## 2021-11-02 DIAGNOSIS — M25551 Pain in right hip: Secondary | ICD-10-CM

## 2021-11-02 DIAGNOSIS — Z0001 Encounter for general adult medical examination with abnormal findings: Secondary | ICD-10-CM | POA: Diagnosis not present

## 2021-11-02 LAB — LIPID PANEL
Cholesterol: 142 mg/dL (ref 0–200)
HDL: 57 mg/dL (ref >39.00)
LDL Cholesterol: 72 mg/dL (ref 0–99)
NonHDL: 84.84
Total CHOL/HDL Ratio: 2
Triglycerides: 63 mg/dL (ref 0.0–149.0)
VLDL: 12.6 mg/dL (ref 0.0–40.0)

## 2021-11-02 LAB — COMPREHENSIVE METABOLIC PANEL
ALT: 16 U/L (ref 0–35)
AST: 19 U/L (ref 0–37)
Albumin: 4.4 g/dL (ref 3.5–5.2)
Alkaline Phosphatase: 67 U/L (ref 39–117)
BUN: 9 mg/dL (ref 6–23)
CO2: 29 mEq/L (ref 19–32)
Calcium: 9.2 mg/dL (ref 8.4–10.5)
Chloride: 102 mEq/L (ref 96–112)
Creatinine, Ser: 0.86 mg/dL (ref 0.40–1.20)
GFR: 83.02 mL/min (ref >60.00)
Glucose, Bld: 85 mg/dL (ref 70–99)
Potassium: 4.8 mEq/L (ref 3.5–5.1)
Sodium: 138 mEq/L (ref 135–145)
Total Bilirubin: 0.5 mg/dL (ref 0.2–1.2)
Total Protein: 7.2 g/dL (ref 6.0–8.3)

## 2021-11-02 LAB — CBC
HCT: 40.3 % (ref 36.0–46.0)
Hemoglobin: 13.5 g/dL (ref 12.0–15.0)
MCHC: 33.6 g/dL (ref 30.0–36.0)
MCV: 84.7 fl (ref 78.0–100.0)
Platelets: 258 10*3/uL (ref 150.0–400.0)
RBC: 4.76 Mil/uL (ref 3.87–5.11)
RDW: 12.8 % (ref 11.5–15.5)
WBC: 5.7 10*3/uL (ref 4.0–10.5)

## 2021-11-02 LAB — TSH: TSH: 1.85 u[IU]/mL (ref 0.35–5.50)

## 2021-11-02 LAB — VITAMIN D 25 HYDROXY (VIT D DEFICIENCY, FRACTURES): VITD: 49.06 ng/mL (ref 30.00–100.00)

## 2021-11-02 LAB — FOLLICLE STIMULATING HORMONE: FSH: 13.1 m[IU]/mL

## 2021-11-02 NOTE — Patient Instructions (Signed)
We will check the x-ray today. ?

## 2021-11-02 NOTE — Progress Notes (Signed)
° °  Subjective:   Patient ID: Elizabeth Rose, female    DOB: [birth year], 41 y.o.   MRN: [id number]  HPI The patient is a new 41 YO female coming in for concerns right hip pain/instability randomly for a few months at least  Desires physical as well.  PMH, [facility], social history reviewed and updated  Review of Systems  Constitutional: Negative.   HENT: Negative.    Eyes: Negative.   Respiratory:  Negative for cough, chest tightness and shortness of breath.   Cardiovascular:  Negative for chest pain, palpitations and leg swelling.  Gastrointestinal:  Negative for abdominal distention, abdominal pain, constipation, diarrhea, nausea and vomiting.  Musculoskeletal:  Positive for arthralgias.  Skin: Negative.   Neurological: Negative.   Psychiatric/Behavioral: Negative.     Objective:  Physical Exam Constitutional:      Appearance: She is well-developed.  HENT:     Head: Normocephalic and atraumatic.  Cardiovascular:     Rate and Rhythm: Normal rate and regular rhythm.  Pulmonary:     Effort: Pulmonary effort is normal. No respiratory distress.     Breath sounds: Normal breath sounds. No wheezing or rales.  Abdominal:     General: Bowel sounds are normal. There is no distension.     Palpations: Abdomen is soft.     Tenderness: There is no abdominal tenderness. There is no rebound.  Musculoskeletal:     Cervical back: Normal range of motion.  Skin:    General: Skin is warm and dry.  Neurological:     Mental Status: She is alert and oriented to person, place, and time.     Coordination: Coordination normal.   Vitals:   11/02/21 0758  BP: 122/70  Pulse: 70  Resp: 18  SpO2: 97%  Weight: 159 lb 3.2 oz (72.2 kg)  Height: 5' 2.5" (1.588 m)   This visit occurred during the SARS-CoV-2 public health emergency.  Safety protocols were in place, including screening questions prior to the visit, additional usage of staff PPE, and extensive cleaning of exam room while observing  appropriate contact time as indicated for disinfecting solutions.   Assessment & Plan:

## 2021-11-02 NOTE — Assessment & Plan Note (Signed)
Getting right x-ray for concerns about random pain and instability.

## 2021-11-02 NOTE — Assessment & Plan Note (Signed)
Flu shot up to date. Covid-19 booster encouraged. Tetanus declines today. Mammogram up to date with gyn, pap smear up to date with gyn. Counseled about sun safety and mole surveillance. Counseled about the dangers of distracted driving. Given 10 year screening recommendations.

## 2021-11-02 NOTE — Assessment & Plan Note (Signed)
Needs recheck getting CMP today.

## 2022-01-27 IMAGING — CR DG CHEST 2V
2 series · 2 of 2 positions shown · non-contrast
Comparison: No priors.

CLINICAL DATA: 40-year-old female with history of cough and
syncope.

EXAM:
CHEST - 2 VIEW

[w chest pa]
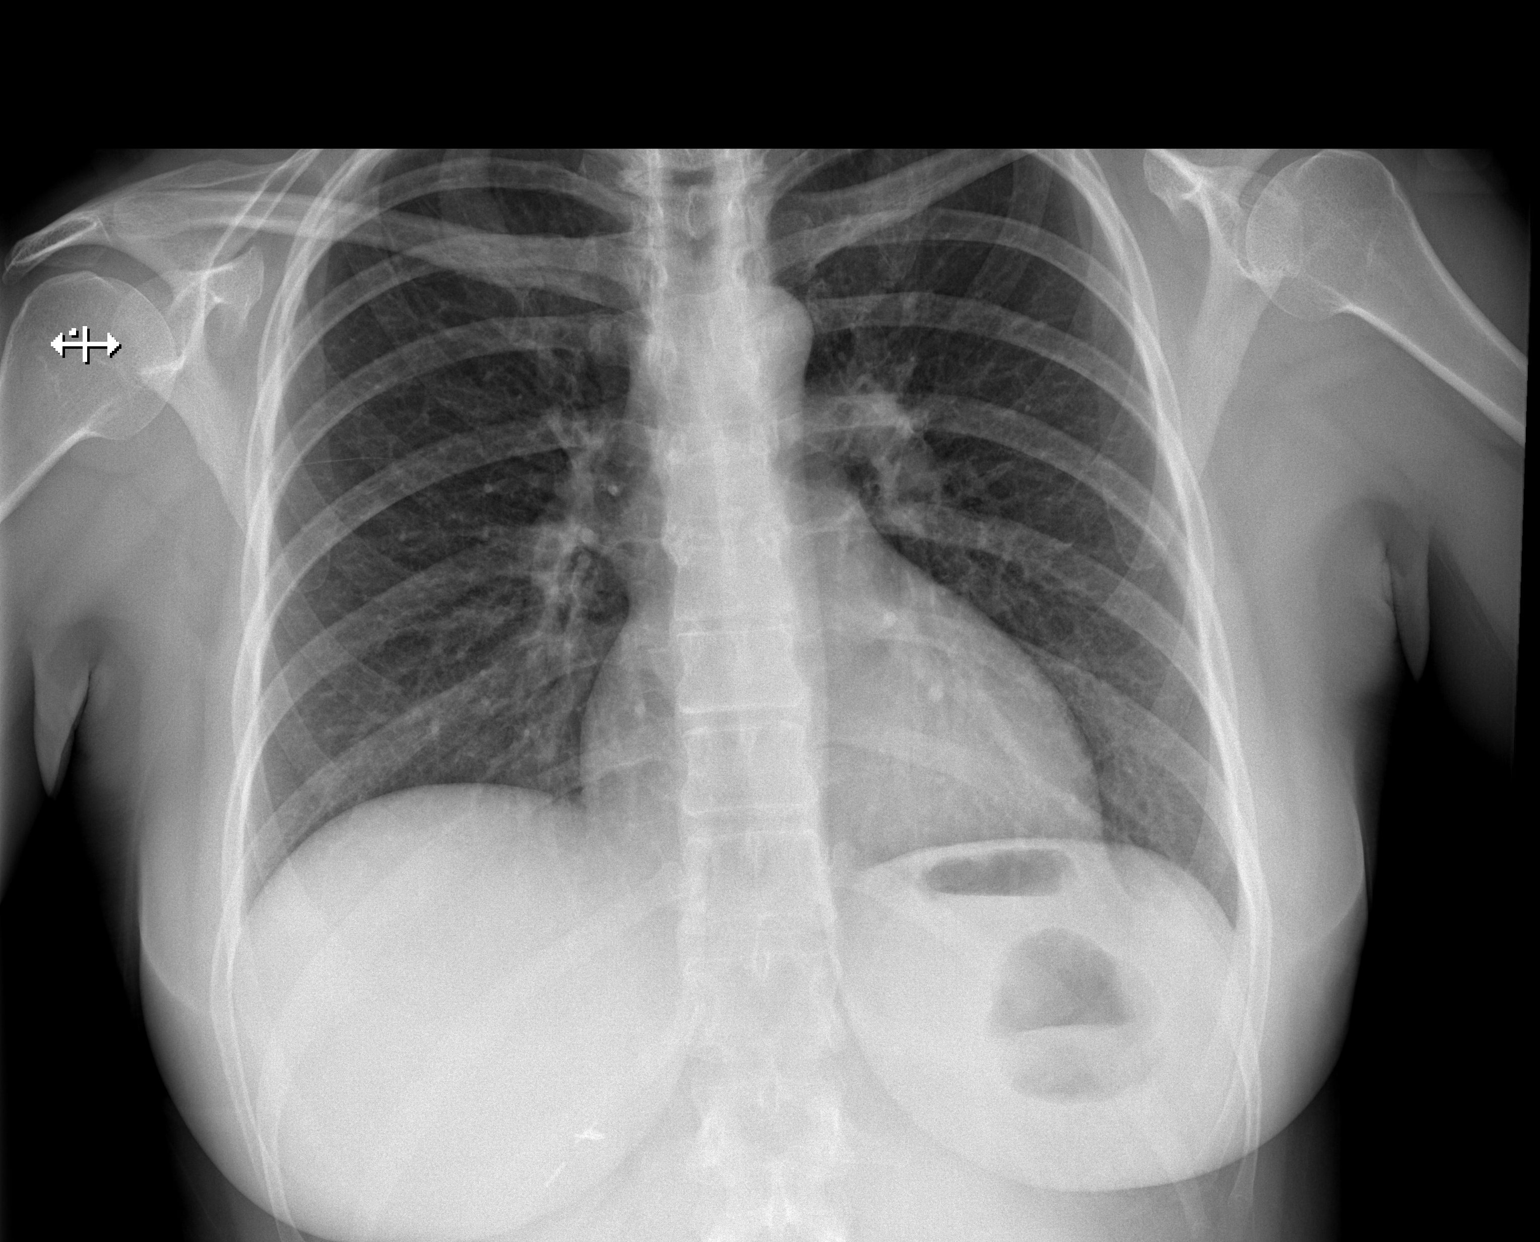

[w chest lat]
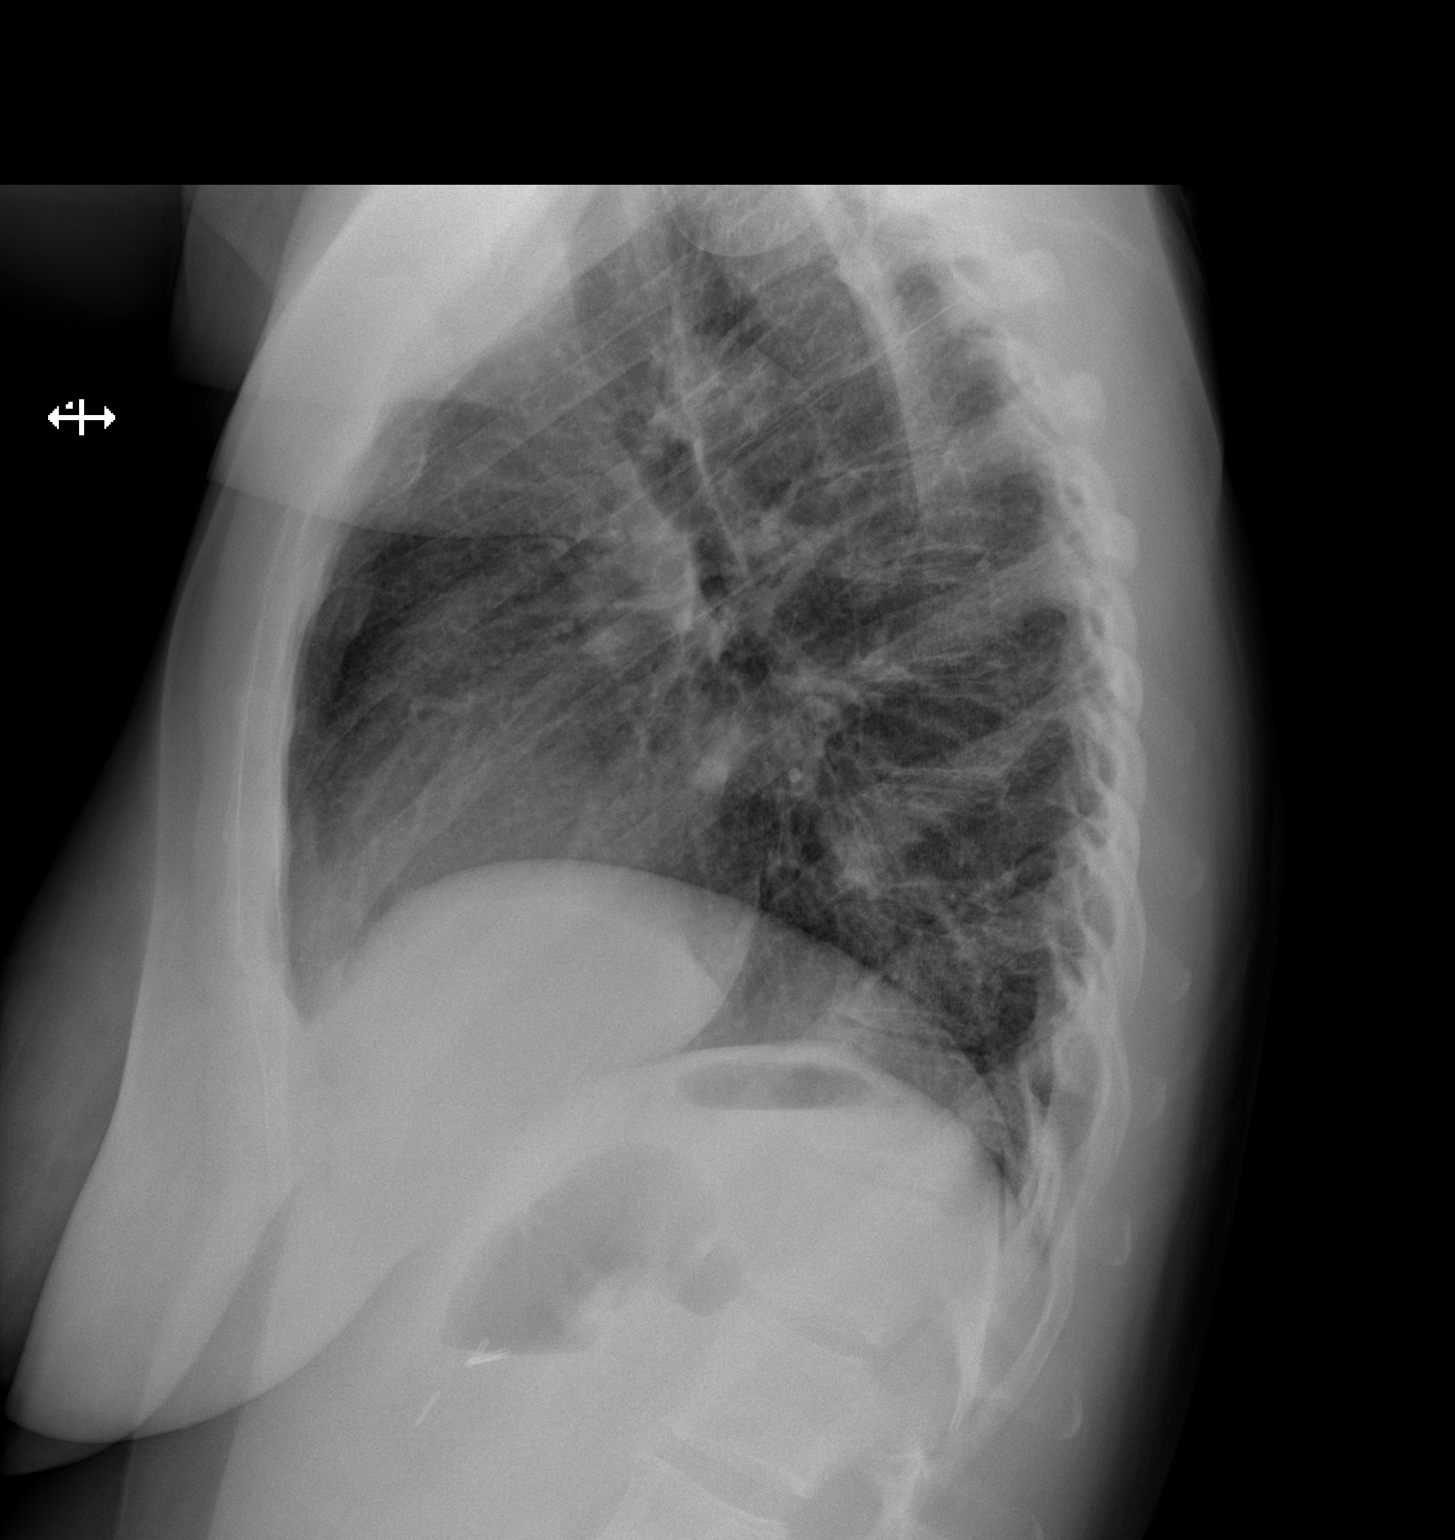

[2 of 2 positions shown; findings below may reference images not displayed]

FINDINGS: Lung volumes are normal. No consolidative airspace disease. No
pleural effusions. No pneumothorax. No pulmonary nodule or mass
noted. Pulmonary vasculature and the cardiomediastinal silhouette
are within normal limits.
IMPRESSION: No radiographic evidence of acute cardiopulmonary disease.

## 2022-04-11 ENCOUNTER — Institutional Professional Consult (permissible substitution): Payer: No Typology Code available for payment source | Admitting: Plastic Surgery

## 2022-06-07 ENCOUNTER — Institutional Professional Consult (permissible substitution): Payer: No Typology Code available for payment source | Admitting: Plastic Surgery

## 2022-06-07 ENCOUNTER — Ambulatory Visit (INDEPENDENT_AMBULATORY_CARE_PROVIDER_SITE_OTHER): Payer: BC Managed Care – PPO | Admitting: Plastic Surgery

## 2022-06-07 ENCOUNTER — Telehealth: Payer: Self-pay

## 2022-06-07 ENCOUNTER — Encounter: Payer: Self-pay | Admitting: Plastic Surgery

## 2022-06-07 DIAGNOSIS — Z6829 Body mass index (BMI) 29.0-29.9, adult: Secondary | ICD-10-CM

## 2022-06-07 DIAGNOSIS — M542 Cervicalgia: Secondary | ICD-10-CM | POA: Diagnosis not present

## 2022-06-07 DIAGNOSIS — G8929 Other chronic pain: Secondary | ICD-10-CM

## 2022-06-07 DIAGNOSIS — M546 Pain in thoracic spine: Secondary | ICD-10-CM

## 2022-06-07 DIAGNOSIS — N62 Hypertrophy of breast: Secondary | ICD-10-CM

## 2022-06-07 DIAGNOSIS — M549 Dorsalgia, unspecified: Secondary | ICD-10-CM | POA: Insufficient documentation

## 2022-06-07 DIAGNOSIS — M545 Low back pain, unspecified: Secondary | ICD-10-CM

## 2022-06-07 DIAGNOSIS — R21 Rash and other nonspecific skin eruption: Secondary | ICD-10-CM

## 2022-06-07 NOTE — Telephone Encounter (Signed)
Faxed record request to [facility] with confirmed receipt. Will send to scan.

## 2022-06-07 NOTE — Progress Notes (Addendum)
Patient ID: Elizabeth Rose, female    DOB: [birth year], 41 y.o.   MRN: [id number]   Chief Complaint  Patient presents with   Consult   Breast Problem    Mammary Hyperplasia: The patient is a 41 y.o. female with a history of mammary hyperplasia for several years.  She has extremely large breasts causing symptoms that include the following: Back pain in the upper and lower back, including neck pain. She pulls or pins her bra straps to provide better lift and relief of the pressure and pain. She notices relief by holding her breast up manually.  Her shoulder straps cause grooves and pain and pressure that requires padding for relief. Pain medication is sometimes required with motrin and tylenol.  Activities that are hindered by enlarged breasts include: exercise and running.  She has tried supportive clothing as well as fitted bras without improvement.  Her breasts are extremely large and fairly symmetric.  She has hyperpigmentation of the inframammary area on both sides.  The sternal to nipple distance on the right is 27 cm and the left is 27 cm.  The IMF distance is 12 cm.  She is 5 feet 1 inches tall and weighs 157 pounds.  The BMI = 29.7 kg/m.  Preoperative bra size = D/DD cup. She would like to be a B cup. The estimated excess breast tissue to be removed at the time of surgery = 405 grams on the left and 405 grams on the right.  Mammogram history: Feb and negative.  Family history of breast cancer:  no.  Tobacco use:  no.   The patient expresses the desire to pursue surgical intervention.     Review of Systems  Constitutional:  Positive for activity change. Negative for appetite change.  Eyes: Negative.   Respiratory: Negative.  Negative for chest tightness and shortness of breath.   Cardiovascular:  Negative for leg swelling.  Gastrointestinal: Negative.   Endocrine: Negative.   Genitourinary: Negative.   Musculoskeletal:  Positive for back pain and neck pain.  Skin:  Positive for  rash.  Hematological: Negative.   Psychiatric/Behavioral: Negative.      History reviewed. No pertinent past medical history.  History reviewed. No pertinent surgical history.   No current outpatient medications on file.   Objective:   Vitals:   06/07/22 1357  BP: 118/74  Pulse: 68  SpO2: 98%    Physical Exam Vitals and nursing note reviewed.  Constitutional:      Appearance: Normal appearance.  HENT:     Head: Normocephalic and atraumatic.  Cardiovascular:     Rate and Rhythm: Normal rate.     Pulses: Normal pulses.  Pulmonary:     Effort: Pulmonary effort is normal.  Abdominal:     Palpations: Abdomen is soft.  Musculoskeletal:        General: No swelling or deformity.  Skin:    General: Skin is warm.     Capillary Refill: Capillary refill takes less than 2 seconds.     Coloration: Skin is not jaundiced.     Findings: No bruising or lesion.  Neurological:     Mental Status: She is alert and oriented to person, place, and time.  Psychiatric:        Mood and Affect: Mood normal.        Behavior: Behavior normal.        Thought Content: Thought content normal.        Judgment: Judgment normal.  Assessment & Plan:  Chronic bilateral thoracic back pain  Symptomatic mammary hypertrophy  Neck pain  The procedure the patient selected and that was best for the patient was discussed. The risk were discussed and include but not limited to the following:  Breast asymmetry, fluid accumulation, firmness of the breast, inability to breast feed, loss of nipple or areola, skin loss, change in skin and nipple sensation, fat necrosis of the breast tissue, bleeding, infection and healing delay.  There are risks of anesthesia and injury to nerves or blood vessels.  Allergic reaction to tape, suture and skin glue are possible.  There will be swelling.  Any of these can lead to the need for revisional surgery.  A breast reduction has potential to interfere with diagnostic  procedures in the future.  This procedure is best done when the breast is fully developed.  Changes in the breast will continue to occur over time: pregnancy, weight gain or weigh loss.    Total time: 40 minutes. This includes time spent with the patient during the visit as well as time spent before and after the visit reviewing the chart, documenting the encounter, ordering pertinent studies and literature for the patient.   Physical therapy:  ordered Mammogram:  done  The patient is a good candidate for bilateral breast reduction with possible liposuction.  She will come back to see [place] in 2-3 months after PT.  Pictures were obtained of the patient and placed in the chart with the patient's or guardian's permission.   Alena Bills Chyrel Taha, DO

## 2022-06-10 ENCOUNTER — Encounter: Payer: Self-pay | Admitting: *Deleted

## 2022-07-03 ENCOUNTER — Ambulatory Visit: Payer: BC Managed Care – PPO | Admitting: Orthopaedic Surgery

## 2022-07-05 ENCOUNTER — Ambulatory Visit: Payer: BC Managed Care – PPO | Admitting: Physical Therapy

## 2022-07-06 ENCOUNTER — Telehealth: Payer: Self-pay | Admitting: *Deleted

## 2022-07-06 NOTE — Telephone Encounter (Signed)
Auth Submitted for CPT [phone] to [employer] [place] via Rafael Bihari Auth: [id number] Transaction ID: [id number]

## 2022-07-12 ENCOUNTER — Other Ambulatory Visit (HOSPITAL_BASED_OUTPATIENT_CLINIC_OR_DEPARTMENT_OTHER): Payer: Self-pay

## 2022-07-12 MED ORDER — INFLUENZA VAC SPLIT QUAD 0.5 ML IM SUSY
PREFILLED_SYRINGE | INTRAMUSCULAR | 0 refills | Status: DC
  Filled 2022-07-12: qty 0.5, 1d supply, fill #0

## 2022-07-24 ENCOUNTER — Telehealth: Payer: Self-pay | Admitting: *Deleted

## 2022-07-24 NOTE — Telephone Encounter (Signed)
LVM to discuss denial and PT for appeal.

## 2022-08-14 ENCOUNTER — Telehealth: Payer: Self-pay

## 2022-08-14 NOTE — Telephone Encounter (Signed)
Called patient, LMVM the status of her PT. She has a follow up appointment with Dr. Marla Roe on 11/17

## 2022-09-13 ENCOUNTER — Ambulatory Visit: Payer: BC Managed Care – PPO | Admitting: Plastic Surgery

## 2022-10-08 ENCOUNTER — Telehealth: Payer: Self-pay

## 2022-10-08 NOTE — Telephone Encounter (Signed)
Patient has been moved to my "Waiting to Hear Back From Patient" spread sheet. Patient has canceled follow up appointment with Dr. Ulice Bold on 11/7 and indicated she will call back at the beginning of next year.  She has canceled 1 of her PT visits. They have tried to call her back 2 other times to reschedule. Both times she had stated she will call back.  Not further action as be done at this done on patients behalf.

## 2022-11-08 ENCOUNTER — Encounter: Payer: Self-pay | Admitting: Internal Medicine

## 2022-11-08 ENCOUNTER — Ambulatory Visit (INDEPENDENT_AMBULATORY_CARE_PROVIDER_SITE_OTHER): Payer: BC Managed Care – PPO | Admitting: Internal Medicine

## 2022-11-08 VITALS — BP 110/70 | HR 65 | Temp 98.2°F | Ht 61.5 in | Wt 164.0 lb

## 2022-11-08 DIAGNOSIS — Z0001 Encounter for general adult medical examination with abnormal findings: Secondary | ICD-10-CM | POA: Diagnosis not present

## 2022-11-08 DIAGNOSIS — R7989 Other specified abnormal findings of blood chemistry: Secondary | ICD-10-CM

## 2022-11-08 DIAGNOSIS — M25551 Pain in right hip: Secondary | ICD-10-CM

## 2022-11-08 DIAGNOSIS — E669 Obesity, unspecified: Secondary | ICD-10-CM

## 2022-11-08 DIAGNOSIS — Z6830 Body mass index (BMI) 30.0-30.9, adult: Secondary | ICD-10-CM

## 2022-11-08 MED ORDER — BUPROPION HCL ER (XL) 150 MG PO TB24: 150.0000 mg | ORAL_TABLET | Freq: Every day | ORAL | 1 refills | Status: DC

## 2022-11-08 NOTE — Patient Instructions (Signed)
We have sent in wellbutrin to take 1 pill daily. About a month in if you want to increase to the full dose let us know.

## 2022-11-08 NOTE — Assessment & Plan Note (Signed)
Rx wellbutrin 150 mg daily and can increase to 300 mg daily in 1 month if needed to help with food cravings.

## 2022-11-08 NOTE — Assessment & Plan Note (Signed)
Improved on labs last year. Off all meds will not check this year. Will check next year.

## 2022-11-08 NOTE — Progress Notes (Signed)
   Subjective:   Patient ID: Elizabeth Rose, female    DOB: 1981/04/05, 42 y.o.   MRN: [id number]  HPI The patient is here for physical.  PMH, [facility], social history reviewed and updated  Review of Systems  Constitutional: Negative.   HENT: Negative.    Eyes: Negative.   Respiratory:  Negative for cough, chest tightness and shortness of breath.   Cardiovascular:  Negative for chest pain, palpitations and leg swelling.  Gastrointestinal:  Negative for abdominal distention, abdominal pain, constipation, diarrhea, nausea and vomiting.  Musculoskeletal:  Positive for arthralgias.  Skin: Negative.   Neurological: Negative.   Psychiatric/Behavioral: Negative.      Objective:  Physical Exam Constitutional:      Appearance: She is well-developed.  HENT:     Head: Normocephalic and atraumatic.  Cardiovascular:     Rate and Rhythm: Normal rate and regular rhythm.  Pulmonary:     Effort: Pulmonary effort is normal. No respiratory distress.     Breath sounds: Normal breath sounds. No wheezing or rales.  Abdominal:     General: Bowel sounds are normal. There is no distension.     Palpations: Abdomen is soft.     Tenderness: There is no abdominal tenderness. There is no rebound.  Musculoskeletal:        General: Tenderness present.     Cervical back: Normal range of motion.     Comments: Right hip pain intermittent  Skin:    General: Skin is warm and dry.  Neurological:     Mental Status: She is alert and oriented to person, place, and time.     Coordination: Coordination normal.     Vitals:   11/08/22 0810  BP: 110/70  Pulse: 65  Temp: 98.2 F (36.8 C)  TempSrc: Oral  SpO2: 97%  Weight: 164 lb (74.4 kg)  Height: 5' 1.5" (1.562 m)    Assessment & Plan:

## 2022-11-08 NOTE — Assessment & Plan Note (Signed)
Flu shot up to date. Covid-19 counseled. Tetanus up to date. Mammogram up to date, pap smear up to date. Counseled about sun safety and mole surveillance. Counseled about the dangers of distracted driving. Given 10 year screening recommendations.   

## 2022-11-08 NOTE — Assessment & Plan Note (Signed)
Ongoing and intermittent she will think about if she wants to pursue orthopedics or sports medicine for assessment.

## 2022-12-06 ENCOUNTER — Encounter: Payer: Self-pay | Admitting: Internal Medicine

## 2022-12-06 MED ORDER — BUPROPION HCL ER (XL) 300 MG PO TB24: 300.0000 mg | ORAL_TABLET | Freq: Every day | ORAL | 1 refills | Status: DC

## 2022-12-25 ENCOUNTER — Ambulatory Visit (INDEPENDENT_AMBULATORY_CARE_PROVIDER_SITE_OTHER): Payer: BC Managed Care – PPO | Admitting: Orthopaedic Surgery

## 2022-12-25 ENCOUNTER — Encounter: Payer: Self-pay | Admitting: Orthopaedic Surgery

## 2022-12-25 DIAGNOSIS — M25551 Pain in right hip: Secondary | ICD-10-CM | POA: Diagnosis not present

## 2022-12-25 NOTE — Progress Notes (Signed)
The patient is a pleasant and active 42 year old female who comes in for evaluation treatment of right hip pain.  The pain is only ever been in the groin and it hurts with certain activities especially pivoting.  He has been going on for several years now with no known injury.  First it was on and off but now it is getting towards a daily issue in terms of when she pivots her hip.  She been sitting and goes to stand up she does get pain in her hip.  She denies any lateral hip pain and denies any sciatic symptoms.  She has a lot of pain after walking.  She has been trying lunges and that does hurt her hip as well.  She says sitting for long peers of time is painful when she gets up and pivots on that right hip.  She denies any issues with her left hip.  Examination of both hips shows significant pain with the right hip on internal ex rotation as well as the extremes of hip flexion.  The left hip exam is entirely normal.  There is no blocks or rotation of the right hip but it is certainly painful with stressing the hip joint.  There is no pain over the lateral aspect of her right hip and no pain over the IT band.  She has negative straight leg raise and 5 out of 5 strength with her hip flexors and hip abductors.  Plain films of her pelvis and right hip are entirely normal with normal-appearing joint space.  I am certainly concerned that she has a labral tear in her right hip based on her signs and symptoms as well as clinical exam findings.  At this point it is medically appropriate to obtain a MRI arthrogram of the right hip to evaluate for labral tear and then we can go from there in terms of treatment options.  She agrees with this treatment plan.  All question concerns were answered and addressed.  Will see her back once we have this MRI of her right hip.

## 2022-12-26 ENCOUNTER — Other Ambulatory Visit: Payer: Self-pay

## 2022-12-26 DIAGNOSIS — M25551 Pain in right hip: Secondary | ICD-10-CM

## 2023-01-24 ENCOUNTER — Ambulatory Visit
Admission: RE | Admit: 2023-01-24 | Discharge: 2023-01-24 | Disposition: A | Discharge status: Home / Self Care | Payer: BC Managed Care – PPO | Source: Ambulatory Visit | Attending: Orthopaedic Surgery | Admitting: Orthopaedic Surgery

## 2023-01-24 DIAGNOSIS — M25551 Pain in right hip: Secondary | ICD-10-CM

## 2023-01-24 MED ORDER — IOPAMIDOL (ISOVUE-M 200) INJECTION 41%
15.0000 mL | Freq: Once | INTRAMUSCULAR | Status: AC
  Administered 2023-01-24: 15 mL via INTRA_ARTICULAR

## 2023-01-30 ENCOUNTER — Ambulatory Visit (INDEPENDENT_AMBULATORY_CARE_PROVIDER_SITE_OTHER): Payer: BC Managed Care – PPO | Admitting: Orthopaedic Surgery

## 2023-01-30 ENCOUNTER — Encounter: Payer: Self-pay | Admitting: Orthopaedic Surgery

## 2023-01-30 DIAGNOSIS — M1611 Unilateral primary osteoarthritis, right hip: Secondary | ICD-10-CM

## 2023-01-30 DIAGNOSIS — M25551 Pain in right hip: Secondary | ICD-10-CM

## 2023-01-30 NOTE — Progress Notes (Signed)
The patient is an active 42 year old female who comes in today to go over MRI of her right hip.  She has been having some right hip and groin pain that is been getting worse with activities.  Her plain films were actually normal of her hip.  However, since her pain was getting worse and we felt there was a potential for a labral tear, we sent her for an MRI arthrogram of the right hip.  I have actually replaced her mother's hips.  On exam again her right hip hurts on extremes of internal and external rotation and the pain is in the groin.  We reviewed her plain films again of her right hip and pelvis and the right hip has normal joint space and normal-appearing hip on plain films.  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  There is subchondral edema in the femoral head.  There is subchondral cyst in the acetabulum and some degenerative tearing of the acetabular labrum.  There is evidence of significant cartilage wear of the right hip at the weightbearing surface.  I went over these findings with her and talked at length in detail about her hip.  She knows to avoid any high impact aerobic activities.  She can try anti-inflammatories and Tylenol arthritis as needed.  Even medications such as glucosamine and turmeric are worth trying.  If she does have significant pain she can always try offloading that hip with a cane in her opposite hand.  If things get worse enough for her she will let us know.  She would even consider a one-time intra-articular steroid injection.  All questions and concerns were answered and addressed.

## 2023-02-03 ENCOUNTER — Encounter: Payer: Self-pay | Admitting: Orthopaedic Surgery

## 2023-02-03 DIAGNOSIS — M25551 Pain in right hip: Secondary | ICD-10-CM

## 2023-02-04 NOTE — Telephone Encounter (Signed)
Referral placed in chart  

## 2023-02-06 ENCOUNTER — Ambulatory Visit (INDEPENDENT_AMBULATORY_CARE_PROVIDER_SITE_OTHER): Payer: BC Managed Care – PPO | Admitting: Orthopaedic Surgery

## 2023-02-06 DIAGNOSIS — M1611 Unilateral primary osteoarthritis, right hip: Secondary | ICD-10-CM | POA: Diagnosis not present

## 2023-02-06 NOTE — Progress Notes (Signed)
Chief Complaint: Right hip pain     History of Present Illness:    Elizabeth Rose is a 42 y.o. female presents with right hip pain which has been ongoing now for multiple years and worse over the last several months.  She has been seeing Dr. Magnus Ivan who had obtained an MRI of the right hip.  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  She is having a very difficult time sitting.  She does enjoy being active and running although this has been extremely limited as result of her hip and groin base pain.  She is here today for further assessment.  She has not previously participated in physical therapy or had any injections.  She does occasionally feel like the hip will pop and click or give out.  When this shifts that is very painful    Surgical History:   none  PMH/PSH/Family History/Social History/Meds/Allergies:   No past medical history on file. No past surgical history on file. Social History   Socioeconomic History   Marital status: Married    Spouse name: Not on file   Number of children: Not on file   Years of education: Not on file   Highest education level: Not on file  Occupational History   Not on file  Tobacco Use   Smoking status: Never   Smokeless tobacco: Never  Substance and Sexual Activity   Alcohol use: Not on file   Drug use: Not on file   Sexual activity: Not on file  Other Topics Concern   Not on file  Social History Narrative   Not on file   Social Determinants of Health   Financial Resource Strain: Not on file  Food Insecurity: Not on file  Transportation Needs: Not on file  Physical Activity: Not on file  Stress: Not on file  Social Connections: Not on file   Family History  Problem Relation Age of Onset   Ovarian cancer Mother    Heart failure Father    No Known Allergies Current Outpatient Medications  Medication Sig Dispense Refill   buPROPion (WELLBUTRIN XL) 300 MG 24  hr tablet Take 1 tablet (300 mg total) by mouth daily. 90 tablet 1   No current facility-administered medications for this visit.   No results found.  Review of Systems:   A ROS was performed including pertinent positives and negatives as documented in the HPI.  Physical Exam :   Constitutional: NAD and appears stated age Neurological: Alert and oriented Psych: Appropriate affect and cooperative There were no vitals taken for this visit.   Comprehensive Musculoskeletal Exam:    Inspection Right Left  Skin No atrophy or gross abnormalities appreciated No atrophy or gross abnormalities appreciated  Palpation    Tenderness None None  Crepitus None None  Range of Motion    Flexion (passive) 120 120  Extension 30 30  IR 55 with pain 45  ER 45 50  Strength    Flexion  5/5 5/5  Extension 5/5 5/5  Special Tests    FABER Negative Negative  FADIR Positive Negative  ER Lag/Capsular Insufficiency Negative Negative  Instability Negative Negative  Sacroiliac pain Negative  Negative   Instability    Generalized Laxity No No  Neurologic    sciatic, femoral,  obturator nerves intact to light sensation  Vascular/Lymphatic    DP pulse 2+ 2+  Lumbar Exam    Patient has symmetric lumbar range of motion with negative pain referral to hip     Imaging:   Xray (4 views right hip): There is a crossover sign of the cephalad most portion of the acetabulum with a lateral center edge angle 36 degrees  MRI (right hip): There is edema in the femoral head as well as anterior aspect of the acetabulum as well as a 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  I did discuss that there also appears to be a possible foveal ligament tear.  There is a small cartilage lesion involving the femoral head  I personally reviewed and interpreted the radiographs.   Assessment:   42 y.o. female with right hip pain consistent with right hip instability.  I did describe that she does have some focal  chondral loss involving the femoral head although overall I do believe this is secondary to as result of her hip instability.  She does distantly remember an injury where she landed directly on her hip when she passed out.  Since this time the hip has been painful with mechanical symptoms.  I do believe that ultimately her labrum tear is the result of this instability.  I would like to have her begin physical therapy at this time for dynamic hip strengthening I will plan to see her back in 6 weeks for reassessment.  She would like to defer injection today.  Plan :    -Return to clinic in 6 weeks for reassessment     I personally saw and evaluated the patient, and participated in the management and treatment plan.  Huel Cote, MD Attending Physician, Orthopedic Surgery  This document was dictated using Dragon voice recognition software. A reasonable attempt at proof reading has been made to minimize errors.

## 2023-02-14 ENCOUNTER — Other Ambulatory Visit: Payer: Self-pay

## 2023-02-14 ENCOUNTER — Ambulatory Visit (INDEPENDENT_AMBULATORY_CARE_PROVIDER_SITE_OTHER): Payer: BC Managed Care – PPO | Admitting: Physical Therapy

## 2023-02-14 ENCOUNTER — Encounter: Payer: Self-pay | Admitting: Physical Therapy

## 2023-02-14 DIAGNOSIS — M25551 Pain in right hip: Secondary | ICD-10-CM | POA: Diagnosis not present

## 2023-02-14 DIAGNOSIS — M6281 Muscle weakness (generalized): Secondary | ICD-10-CM

## 2023-02-14 NOTE — Therapy (Signed)
OUTPATIENT PHYSICAL THERAPY LOWER EXTREMITY EVALUATION   Patient Name: Elizabeth Rose MRN: [id number] DOB:07/03/1981, 42 y.o., female Today's Date: 02/14/2023  END OF SESSION:  PT End of Session - 02/14/23 1321     Visit Number 1    Number of Visits 7    Authorization Type BC[employer]    Authorization Time Period 02/14/23 to 03/28/23    Authorization - Number of Visits 20    PT Start Time 1303    PT Stop Time 1339    PT Time Calculation (min) 36 min    Activity Tolerance Patient tolerated treatment well    Behavior During Therapy [facility] for tasks assessed/performed             History reviewed. No pertinent past medical history. History reviewed. No pertinent surgical history. Patient Active Problem List   Diagnosis Date Noted   Unilateral primary osteoarthritis, right hip 01/30/2023   Obesity 11/08/2022   Symptomatic mammary hypertrophy 06/07/2022   Right hip pain 11/02/2021   Encounter for general adult medical examination with abnormal findings 11/02/2021   Elevated LFTs 06/03/2021   Family history of ovarian carcinoma 06/05/2018    PCP: Hillard Danker MD   REFERRING PROVIDER: Huel Cote, MD  REFERRING DIAG: M16.11 (ICD-10-CM) - Unilateral primary osteoarthritis, right hip  THERAPY DIAG:  Pain in right hip  Muscle weakness (generalized)  Rationale for Evaluation and Treatment: Rehabilitation  ONSET DATE: 02/06/2023  SUBJECTIVE:   SUBJECTIVE STATEMENT:  When the hip hurts I can barely put weight on my right  leg, basically have to hobble around my house on my left leg. Had a fall in my kitchen where I passed out, the hip didn't bother me before this. Pain has become more frequent and intense, its become more frequent that it flares up and gets to the point that I can't put weight on it. When its not flared up, I can get around just fine. Had an incident where I crouched down deeply to look at flowers, this flared it up and I had to hobble around the rest  of the weekend. Repeated bending down/standing up when cleaning flares it up too.   PERTINENT HISTORY: 42 y.o. female with right hip pain consistent with right hip instability.  I did describe that she does have some focal chondral loss involving the femoral head although overall I do believe this is secondary to as result of her hip instability.  She does distantly remember an injury where she landed directly on her hip when she passed out.  Since this time the hip has been painful with mechanical symptoms.  I do believe that ultimately her labrum tear is the result of this instability.  I would like to have her begin physical therapy at this time for dynamic hip strengthening I will plan to see her back in 6 weeks for reassessment.  She would like to defer injection today. PAIN:  Are you having pain? No; at worst pain can get to 8-9/10  PRECAUTIONS: None  WEIGHT BEARING RESTRICTIONS: No  FALLS:  Has patient fallen in last 6 months? No  LIVING ENVIRONMENT: Lives with: lives with their spouse Lives in: House/apartment Stairs: has stairs inside but can live on main floor  Has following equipment at home: None  OCCUPATION: front office of dental office   PLOF: Independent, Independent with basic ADLs, Independent with gait, and Independent with transfers  PATIENT GOALS: "sounds like therapy won't necessarily fix my hip, surgeon said PT is precursor because people  who get PT do much better with surgery"   NEXT MD VISIT: Dr. Steward Drone May 22nd   OBJECTIVE:   DIAGNOSTIC FINDINGS: IMPRESSION: 1. Small superior anterior labral tear. 2. Partial-thickness cartilage loss of the right superior anterior femoral head and acetabulum with small focal areas of high-grade partial-thickness cartilage loss on either side and subchondral cystic changes in the superior anterior acetabulum as well as subchondral marrow edema in the superior anterior femoral head.  PATIENT SURVEYS:  FOTO 33, predicted  14  COGNITION: Overall cognitive status: Within functional limits for tasks assessed     SENSATION: Not tested  EDEMA:   None reported   MUSCLE LENGTH:  Quads and hamstrings WNL Hamstrings mild limitation B Piriformis WNL B   POSTURE:   PALPATION:   LOWER EXTREMITY ROM:  Active ROM Right eval Left eval  Hip flexion WNL    Hip extension    Hip abduction WNL    Hip adduction    Hip internal rotation WNL    Hip external rotation WNL    Knee flexion    Knee extension    Ankle dorsiflexion    Ankle plantarflexion    Ankle inversion    Ankle eversion     (Blank rows = not tested)  LOWER EXTREMITY MMT:  MMT Right eval Left eval  Hip flexion 4+ 4+  Hip extension 4 4+  Hip abduction 3+ 3  Hip adduction    Hip internal rotation    Hip external rotation    Knee flexion 4+ 4+  Knee extension 4 4  Ankle dorsiflexion 4+ 4+  Ankle plantarflexion    Ankle inversion    Ankle eversion     (Blank rows = not tested)  LOWER EXTREMITY SPECIAL TESTS:    FUNCTIONAL TESTS:    GAIT:     TODAY'S TREATMENT:                                                                                                                              DATE:   Eval  Objective measures/appropriate education + HEP   TherEx  Nustep L6 x6 minutes BLEs only Bridges 10x3 with ABD into green TB Sidelying hip ABD green TB x10 B Quadruped hip extensions green TB x10 B Standard plank on forearms 1x20 seconds        PATIENT EDUCATION:  Education details: exam findings, POC, HEP Person educated: Patient Education method: Programmer, multimedia, Demonstration, and Handouts Education comprehension: verbalized understanding, returned demonstration, and needs further education  HOME EXERCISE PROGRAM: Access Code: [email or web address] URL: [email or web address] Date: 02/14/2023 Prepared by: Nedra Hai  Exercises - Supine Bridge with Resistance Band  - 2 x daily - 7 x weekly - 1 sets - 10  reps - 3 hold - Sidelying Hip Abduction with Resistance at Thighs  - 2 x daily - 7 x weekly - 1 sets - 10 reps - 1 hold - Quadruped Bent Leg Hip Extension  - 2  x daily - 7 x weekly - 1 sets - 10 reps - 1 hold - Standard Plank  - 2 x daily - 7 x weekly - 1 sets - 3 reps - 20 hold  ASSESSMENT:  CLINICAL IMPRESSION: Patient is a 43 y.o. F who was seen today for physical therapy evaluation and treatment for R hip pain. See imaging results above. Exam reveals functional muscle weakness as primary impairment at this time, note she did come on a relatively pain free day- anticipate that ROM measures would likely be more impaired on a day where pain is higher. Anticipate that she will do well with skilled PT services to help build strength and hip stability, and to prepare for potential upcoming surgery.   OBJECTIVE IMPAIRMENTS: decreased mobility, difficulty walking, decreased strength, and pain.   ACTIVITY LIMITATIONS: standing, squatting, transfers, and locomotion level  PARTICIPATION LIMITATIONS: driving, shopping, community activity, and occupation  PERSONAL FACTORS: Age, Education, Fitness, Past/current experiences, and Time since onset of injury/illness/exacerbation are also affecting patient's functional outcome.   REHAB POTENTIAL: Good  CLINICAL DECISION MAKING: Stable/uncomplicated  EVALUATION COMPLEXITY: Low   GOALS: Goals reviewed with patient? Yes  SHORT TERM GOALS: Target date: 03/07/2023   Will be compliant with appropriate progressive HEP  Baseline: Goal status: INITIAL  2.  Pain to be no more than 6/10 at worst  Baseline:  Goal status: INITIAL  3.  Frequency of pain flare ups in R hip to have improved by 50%  Baseline:  Goal status: INITIAL   LONG TERM GOALS: Target date: 03/28/2023    MMT to be 5/5 grossly  Baseline:  Goal status: INITIAL  2.  Pain to be no more than 3/10 at worst  Baseline:  Goal status: INITIAL  3.  Frequency of pain flare ups to have  improved by 75% Baseline:  Goal status: INITIAL  4.  Will be able to complete deep squats/crouches, steps, and regular exercise program with pain no more than 3/10 R hip  Baseline:  Goal status: INITIAL    PLAN:  PT FREQUENCY: 1x/week  PT DURATION: 6 weeks  PLANNED INTERVENTIONS: Therapeutic exercises, Therapeutic activity, Neuromuscular re-education, Balance training, Gait training, Patient/Family education, Self Care, Joint mobilization, Stair training, Aquatic Therapy, Dry Needling, Electrical stimulation, Cryotherapy, Moist heat, Taping, Ultrasound, Ionotophoresis /ml Dexamethasone, Manual therapy, and Re-evaluation  PLAN FOR NEXT SESSION: focus on functional strength- weekly HEP updates   Nedra Hai PT DPT PN2

## 2023-02-21 ENCOUNTER — Encounter: Payer: Self-pay | Admitting: Physical Therapy

## 2023-02-21 ENCOUNTER — Ambulatory Visit (INDEPENDENT_AMBULATORY_CARE_PROVIDER_SITE_OTHER): Payer: BC Managed Care – PPO | Admitting: Physical Therapy

## 2023-02-21 DIAGNOSIS — M6281 Muscle weakness (generalized): Secondary | ICD-10-CM | POA: Diagnosis not present

## 2023-02-21 DIAGNOSIS — M25551 Pain in right hip: Secondary | ICD-10-CM | POA: Diagnosis not present

## 2023-02-21 NOTE — Therapy (Signed)
OUTPATIENT PHYSICAL THERAPY LOWER EXTREMITY TREATMENT   Patient Name: Elizabeth Rose MRN: [id number] DOB:Jun 10, 1981, 42 y.o., female Today's Date: 02/21/2023  END OF SESSION:  PT End of Session - 02/21/23 1317     Visit Number 2    Number of Visits 7    Authorization Type BC[employer]    Authorization Time Period 02/14/23 to 03/28/23    Authorization - Number of Visits 20    PT Start Time 1302    PT Stop Time 1342    PT Time Calculation (min) 40 min    Activity Tolerance Patient tolerated treatment well    Behavior During Therapy [facility] for tasks assessed/performed              History reviewed. No pertinent past medical history. History reviewed. No pertinent surgical history. Patient Active Problem List   Diagnosis Date Noted   Unilateral primary osteoarthritis, right hip 01/30/2023   Obesity 11/08/2022   Symptomatic mammary hypertrophy 06/07/2022   Right hip pain 11/02/2021   Encounter for general adult medical examination with abnormal findings 11/02/2021   Elevated LFTs 06/03/2021   Family history of ovarian carcinoma 06/05/2018    PCP: Hillard Danker MD   REFERRING PROVIDER: Huel Cote, MD  REFERRING DIAG: M16.11 (ICD-10-CM) - Unilateral primary osteoarthritis, right hip  THERAPY DIAG:  Pain in right hip  Muscle weakness (generalized)  Rationale for Evaluation and Treatment: Rehabilitation  ONSET DATE: 02/06/2023  SUBJECTIVE:   SUBJECTIVE STATEMENT:  I feel like things are feeling better, there are still several times where I got up from my desk at work and had shooting pain in my groin. Exercises help it feel better after I've done them. If I do something and feel the hip wanting to act up, I move slow and give it a second. No flare ups but have been avoiding things I feel trigger it. Exercises don't trigger it.    PERTINENT HISTORY: 42 y.o. female with right hip pain consistent with right hip instability.  I did describe that she does have some  focal chondral loss involving the femoral head although overall I do believe this is secondary to as result of her hip instability.  She does distantly remember an injury where she landed directly on her hip when she passed out.  Since this time the hip has been painful with mechanical symptoms.  I do believe that ultimately her labrum tear is the result of this instability.  I would like to have her begin physical therapy at this time for dynamic hip strengthening I will plan to see her back in 6 weeks for reassessment.  She would like to defer injection today. PAIN:  Are you having pain? No 0/10, hasn't pushed through things that trigger/flare pain  PRECAUTIONS: None  WEIGHT BEARING RESTRICTIONS: No  FALLS:  Has patient fallen in last 6 months? No  LIVING ENVIRONMENT: Lives with: lives with their spouse Lives in: House/apartment Stairs: has stairs inside but can live on main floor  Has following equipment at home: None  OCCUPATION: front office of dental office   PLOF: Independent, Independent with basic ADLs, Independent with gait, and Independent with transfers  PATIENT GOALS: "sounds like therapy won't necessarily fix my hip, surgeon said PT is precursor because people who get PT do much better with surgery"   NEXT MD VISIT: Dr. Steward Drone May 22nd   OBJECTIVE:   DIAGNOSTIC FINDINGS: IMPRESSION: 1. Small superior anterior labral tear. 2. Partial-thickness cartilage loss of the right superior anterior femoral  head and acetabulum with small focal areas of high-grade partial-thickness cartilage loss on either side and subchondral cystic changes in the superior anterior acetabulum as well as subchondral marrow edema in the superior anterior femoral head.  PATIENT SURVEYS:  FOTO 33, predicted 18  COGNITION: Overall cognitive status: Within functional limits for tasks assessed     SENSATION: Not tested  EDEMA:   None reported   MUSCLE LENGTH:  Quads and hamstrings  WNL Hamstrings mild limitation B Piriformis WNL B   POSTURE:   PALPATION:   LOWER EXTREMITY ROM:  Active ROM Right eval Left eval  Hip flexion WNL    Hip extension    Hip abduction WNL    Hip adduction    Hip internal rotation WNL    Hip external rotation WNL    Knee flexion    Knee extension    Ankle dorsiflexion    Ankle plantarflexion    Ankle inversion    Ankle eversion     (Blank rows = not tested)  LOWER EXTREMITY MMT:  MMT Right eval Left eval  Hip flexion 4+ 4+  Hip extension 4 4+  Hip abduction 3+ 3  Hip adduction    Hip internal rotation    Hip external rotation    Knee flexion 4+ 4+  Knee extension 4 4  Ankle dorsiflexion 4+ 4+  Ankle plantarflexion    Ankle inversion    Ankle eversion     (Blank rows = not tested)  LOWER EXTREMITY SPECIAL TESTS:    FUNCTIONAL TESTS:    GAIT:     TODAY'S TREATMENT:                                                                                                                              DATE:   02/21/23  TherEx  Nustep L7x6 minutes BLEs only  TM belt pushes x12 B for hip extensor strength Single leg bridges x10 B Planks 4x30 seconds Quadruped hip ABD x12 B green TB above knees Quadruped hip extensions x12 B green TB above knees Side planks  on elbow 3x15 seconds B  Shuttle LE press 75# DL 1O10, single leg 96# [id number]  Intro to deadlifts 5# KB x12  focus on form  Intro to good mornings 5# KB x12 focus on form     Eval  Objective measures/appropriate education + HEP   TherEx  Nustep L6 x6 minutes BLEs only Bridges 10x3 with ABD into green TB Sidelying hip ABD green TB x10 B Quadruped hip extensions green TB x10 B Standard plank on forearms 1x20 seconds        PATIENT EDUCATION:  Education details: exam findings, POC, HEP Person educated: Patient Education method: Programmer, multimedia, Demonstration, and Handouts Education comprehension: verbalized understanding, returned demonstration,  and needs further education  HOME EXERCISE PROGRAM:  Access Code: [email or web address] URL: [email or web address] Date: 02/21/2023 Prepared by: Nedra Hai  Exercises - Supine Bridge with Resistance Band  - 2  x daily - 7 x weekly - 1 sets - 10 reps - 3 hold - Sidelying Hip Abduction with Resistance at Thighs  - 2 x daily - 7 x weekly - 1 sets - 10 reps - 1 hold - Quadruped Bent Leg Hip Extension  - 2 x daily - 7 x weekly - 1 sets - 10 reps - 1 hold - Standard Plank  - 2 x daily - 7 x weekly - 1 sets - 3 reps - 20 hold - Single Leg Bridge  - 2 x daily - 7 x weekly - 1 sets - 10 reps - 2 hold - Quadruped Hip Abduction with Resistance Loop  - 1 x daily - 7 x weekly - 3 sets - 10 reps  ASSESSMENT:  CLINICAL IMPRESSION:  Yorley arrives today doing well, sounds like HEP has been improving symptoms already. Progressed all strengthening tasks as able and tolerated. Updated HEP as per POC.  Will continue efforts. Tolerating program well so far.   OBJECTIVE IMPAIRMENTS: decreased mobility, difficulty walking, decreased strength, and pain.   ACTIVITY LIMITATIONS: standing, squatting, transfers, and locomotion level  PARTICIPATION LIMITATIONS: driving, shopping, community activity, and occupation  PERSONAL FACTORS: Age, Education, Fitness, Past/current experiences, and Time since onset of injury/illness/exacerbation are also affecting patient's functional outcome.   REHAB POTENTIAL: Good  CLINICAL DECISION MAKING: Stable/uncomplicated  EVALUATION COMPLEXITY: Low   GOALS: Goals reviewed with patient? Yes  SHORT TERM GOALS: Target date: 03/07/2023   Will be compliant with appropriate progressive HEP  Baseline: Goal status: INITIAL  2.  Pain to be no more than 6/10 at worst  Baseline:  Goal status: INITIAL  3.  Frequency of pain flare ups in R hip to have improved by 50%  Baseline:  Goal status: INITIAL   LONG TERM GOALS: Target date: 03/28/2023    MMT to be 5/5 grossly   Baseline:  Goal status: INITIAL  2.  Pain to be no more than 3/10 at worst  Baseline:  Goal status: INITIAL  3.  Frequency of pain flare ups to have improved by 75% Baseline:  Goal status: INITIAL  4.  Will be able to complete deep squats/crouches, steps, and regular exercise program with pain no more than 3/10 R hip  Baseline:  Goal status: INITIAL    PLAN:  PT FREQUENCY: 1x/week  PT DURATION: 6 weeks  PLANNED INTERVENTIONS: Therapeutic exercises, Therapeutic activity, Neuromuscular re-education, Balance training, Gait training, Patient/Family education, Self Care, Joint mobilization, Stair training, Aquatic Therapy, Dry Needling, Electrical stimulation, Cryotherapy, Moist heat, Taping, Ultrasound, Ionotophoresis 4mg /ml Dexamethasone, Manual therapy, and Re-evaluation  PLAN FOR NEXT SESSION: focus on functional strength- weekly HEP updates, f/u on DL and good mornings   Nedra Hai PT DPT PN2

## 2023-02-28 ENCOUNTER — Ambulatory Visit (INDEPENDENT_AMBULATORY_CARE_PROVIDER_SITE_OTHER): Payer: BC Managed Care – PPO | Admitting: Physical Therapy

## 2023-02-28 ENCOUNTER — Encounter: Payer: BC Managed Care – PPO | Admitting: Physical Therapy

## 2023-02-28 ENCOUNTER — Ambulatory Visit (HOSPITAL_BASED_OUTPATIENT_CLINIC_OR_DEPARTMENT_OTHER): Payer: BC Managed Care – PPO | Admitting: Orthopaedic Surgery

## 2023-02-28 ENCOUNTER — Encounter: Payer: Self-pay | Admitting: Physical Therapy

## 2023-02-28 DIAGNOSIS — M6281 Muscle weakness (generalized): Secondary | ICD-10-CM | POA: Diagnosis not present

## 2023-02-28 DIAGNOSIS — M25551 Pain in right hip: Secondary | ICD-10-CM

## 2023-02-28 NOTE — Therapy (Signed)
OUTPATIENT PHYSICAL THERAPY LOWER EXTREMITY TREATMENT   Patient Name: Elizabeth Rose MRN: [id number] DOB:1980-12-20, 42 y.o., female Today's Date: 02/28/2023  END OF SESSION:  PT End of Session - 02/28/23 0857     Visit Number 3    Number of Visits 7    Authorization Type BCBS    Authorization Time Period 02/14/23 to 03/28/23    Authorization - Number of Visits 20    PT Start Time 0848    PT Stop Time 0926    PT Time Calculation (min) 38 min    Activity Tolerance Patient tolerated treatment well    Behavior During Therapy [facility] for tasks assessed/performed               History reviewed. No pertinent past medical history. History reviewed. No pertinent surgical history. Patient Active Problem List   Diagnosis Date Noted   Unilateral primary osteoarthritis, right hip 01/30/2023   Obesity 11/08/2022   Symptomatic mammary hypertrophy 06/07/2022   Right hip pain 11/02/2021   Encounter for general adult medical examination with abnormal findings 11/02/2021   Elevated LFTs 06/03/2021   Family history of ovarian carcinoma 06/05/2018    PCP: Hillard Danker MD   REFERRING PROVIDER: Huel Cote, MD  REFERRING DIAG: M16.11 (ICD-10-CM) - Unilateral primary osteoarthritis, right hip  THERAPY DIAG:  Pain in right hip  Muscle weakness (generalized)  Rationale for Evaluation and Treatment: Rehabilitation  ONSET DATE: 02/06/2023  SUBJECTIVE:   SUBJECTIVE STATEMENT:  I felt better last weekend, gave the dog a bath and took a 2.5 mile walk and then it started bothering me. Hip started bothering me again after the walk. I can go a long time on the bike but walking on the hip tends to bother it, it eased off as the week went on but still has a deep discomfort.   PERTINENT HISTORY: 42 y.o. female with right hip pain consistent with right hip instability.  I did describe that she does have some focal chondral loss involving the femoral head although overall I do believe  this is secondary to as result of her hip instability.  She does distantly remember an injury where she landed directly on her hip when she passed out.  Since this time the hip has been painful with mechanical symptoms.  I do believe that ultimately her labrum tear is the result of this instability.  I would like to have her begin physical therapy at this time for dynamic hip strengthening I will plan to see her back in 6 weeks for reassessment.  She would like to defer injection today. PAIN:  Are you having pain? Yes: NPRS scale: 3/10 Pain location: right groin Pain description: deep, nagging discomfort  Aggravating factors: longer walks  Relieving factors: exercises from PT    PRECAUTIONS: None  WEIGHT BEARING RESTRICTIONS: No  FALLS:  Has patient fallen in last 6 months? No  LIVING ENVIRONMENT: Lives with: lives with their spouse Lives in: House/apartment Stairs: has stairs inside but can live on main floor  Has following equipment at home: None  OCCUPATION: front office of dental office   PLOF: Independent, Independent with basic ADLs, Independent with gait, and Independent with transfers  PATIENT GOALS: "sounds like therapy won't necessarily fix my hip, surgeon said PT is precursor because people who get PT do much better with surgery"   NEXT MD VISIT: Dr. Steward Drone May 22nd   OBJECTIVE:   DIAGNOSTIC FINDINGS: IMPRESSION: 1. Small superior anterior labral tear. 2. Partial-thickness cartilage loss  of the right superior anterior femoral head and acetabulum with small focal areas of high-grade partial-thickness cartilage loss on either side and subchondral cystic changes in the superior anterior acetabulum as well as subchondral marrow edema in the superior anterior femoral head.  PATIENT SURVEYS:  FOTO 33, predicted 61  COGNITION: Overall cognitive status: Within functional limits for tasks assessed     SENSATION: Not tested  EDEMA:   None reported   MUSCLE  LENGTH:  Quads and hamstrings WNL Hamstrings mild limitation B Piriformis WNL B   POSTURE:   PALPATION:   LOWER EXTREMITY ROM:  Active ROM Right eval Left eval  Hip flexion WNL    Hip extension    Hip abduction WNL    Hip adduction    Hip internal rotation WNL    Hip external rotation WNL    Knee flexion    Knee extension    Ankle dorsiflexion    Ankle plantarflexion    Ankle inversion    Ankle eversion     (Blank rows = not tested)  LOWER EXTREMITY MMT:  MMT Right eval Left eval  Hip flexion 4+ 4+  Hip extension 4 4+  Hip abduction 3+ 3  Hip adduction    Hip internal rotation    Hip external rotation    Knee flexion 4+ 4+  Knee extension 4 4  Ankle dorsiflexion 4+ 4+  Ankle plantarflexion    Ankle inversion    Ankle eversion     (Blank rows = not tested)  LOWER EXTREMITY SPECIAL TESTS:    FUNCTIONAL TESTS:    GAIT:     TODAY'S TREATMENT:                                                                                                                              DATE:    02/28/23  Education on avoiding irritating activities, difference on load on hip joint with over ground walking versus on TM versus reclining bike; encouraged trying TM walking to see if she notices a difference in pain due to reduced ground reaction forces, if pain is still elevated after would stick to bike for now. Still building strength and stability in hip mm groups.   TherEx  Nustep L7 x6 minutes BLEs only Quadruped hip ABD 2x10 B blue TB above knees  Quadruped hip extension 2x10 B blue TB above knees Single leg bridges off of BOSU 2x10 B Goblet squats with 15# KB 2x10  DL with 57# KB 8I69  Shuttle LE press: DL 629# 5M84, SL 13# 2G40 B, then dropped 62# 1x12 B due to R LE pain with exercise   3 way taps with SLS on blue foam pad x10 B     02/21/23  TherEx  Nustep L7x6 minutes BLEs only  TM belt pushes x12 B for hip extensor strength Single leg bridges x10  B Planks 4x30 seconds Quadruped hip ABD x12 B green TB above knees Quadruped  hip extensions x12 B green TB above knees Side planks  on elbow 3x15 seconds B  Shuttle LE press 75# DL 1O10, single leg 96# 0A54  Intro to deadlifts 5# KB x12  focus on form  Intro to good mornings 5# KB x12 focus on form     Eval  Objective measures/appropriate education + HEP   TherEx  Nustep L6 x6 minutes BLEs only Bridges 10x3 with ABD into green TB Sidelying hip ABD green TB x10 B Quadruped hip extensions green TB x10 B Standard plank on forearms 1x20 seconds        PATIENT EDUCATION:  Education details: exam findings, POC, HEP Person educated: Patient Education method: Explanation, Demonstration, and Handouts Education comprehension: verbalized understanding, returned demonstration, and needs further education  HOME EXERCISE PROGRAM:  Access Code: [email or web address] URL: [email or web address] Date: 02/28/2023 Prepared by: Nedra Hai  Exercises - Supine Bridge with Resistance Band  - 2 x daily - 7 x weekly - 1 sets - 10 reps - 3 hold - Sidelying Hip Abduction with Resistance at Thighs  - 2 x daily - 7 x weekly - 1 sets - 10 reps - 1 hold - Quadruped Bent Leg Hip Extension  - 2 x daily - 7 x weekly - 1 sets - 10 reps - 1 hold - Standard Plank  - 2 x daily - 7 x weekly - 1 sets - 3 reps - 20 hold - Single Leg Bridge  - 2 x daily - 7 x weekly - 1 sets - 10 reps - 2 hold - Quadruped Hip Abduction with Resistance Loop  - 2 x daily - 7 x weekly - 1 sets - 10 reps - Goblet Squat with Kettlebell  - 2 x daily - 7 x weekly - 10 reps - 1 hold - Half Deadlift with Kettlebell  - 2 x daily - 7 x weekly - 1 sets - 10 reps - 1 hold  ASSESSMENT:  CLINICAL IMPRESSION:  [place] arrives today doing OK, her hip is still getting aggravated with weight bearing tasks such as long walks- see above for education provided. Continued working on progressions of functional strengthening today as  tolerated, continued to update HEP as appropriate. Reviewed STGs, sounds like hip is still easily aggravated by the same functional tasks that she described at eval. Will continue efforts.   OBJECTIVE IMPAIRMENTS: decreased mobility, difficulty walking, decreased strength, and pain.   ACTIVITY LIMITATIONS: standing, squatting, transfers, and locomotion level  PARTICIPATION LIMITATIONS: driving, shopping, community activity, and occupation  PERSONAL FACTORS: Age, Education, Fitness, Past/current experiences, and Time since onset of injury/illness/exacerbation are also affecting patient's functional outcome.   REHAB POTENTIAL: Good  CLINICAL DECISION MAKING: Stable/uncomplicated  EVALUATION COMPLEXITY: Low   GOALS: Goals reviewed with patient? Yes  SHORT TERM GOALS: Target date: 03/07/2023   Will be compliant with appropriate progressive HEP  Baseline: Goal status: MET 02/28/23  2.  Pain to be no more than 6/10 at worst  Baseline:  Goal status: IN PROGRESS 02/28/23 can still flare to 7/10 at times   3.  Frequency of pain flare ups in R hip to have improved by 50%  Baseline:  Goal status: IN PROGRESS 02/28/23 task dependent but has not changed with aggravating tasks    LONG TERM GOALS: Target date: 03/28/2023    MMT to be 5/5 grossly  Baseline:  Goal status: INITIAL  2.  Pain to be no more than 3/10 at worst  Baseline:  Goal status: INITIAL  3.  Frequency of pain flare ups to have improved by 75% Baseline:  Goal status: INITIAL  4.  Will be able to complete deep squats/crouches, steps, and regular exercise program with pain no more than 3/10 R hip  Baseline:  Goal status: INITIAL    PLAN:  PT FREQUENCY: 1x/week  PT DURATION: 6 weeks  PLANNED INTERVENTIONS: Therapeutic exercises, Therapeutic activity, Neuromuscular re-education, Balance training, Gait training, Patient/Family education, Self Care, Joint mobilization, Stair training, Aquatic Therapy, Dry Needling,  Electrical stimulation, Cryotherapy, Moist heat, Taping, Ultrasound, Ionotophoresis 4mg /ml Dexamethasone, Manual therapy, and Re-evaluation  PLAN FOR NEXT SESSION: focus on functional strength- weekly HEP updates, f/u on good mornings. Cautious progression with CKC exercises R LE, avoid aggravating pain.   Nedra Hai PT DPT PN2

## 2023-03-07 ENCOUNTER — Encounter: Payer: Self-pay | Admitting: Physical Therapy

## 2023-03-07 ENCOUNTER — Ambulatory Visit (INDEPENDENT_AMBULATORY_CARE_PROVIDER_SITE_OTHER): Payer: BC Managed Care – PPO | Admitting: Physical Therapy

## 2023-03-07 DIAGNOSIS — M25551 Pain in right hip: Secondary | ICD-10-CM

## 2023-03-07 DIAGNOSIS — M6281 Muscle weakness (generalized): Secondary | ICD-10-CM | POA: Diagnosis not present

## 2023-03-07 NOTE — Therapy (Signed)
OUTPATIENT PHYSICAL THERAPY LOWER EXTREMITY TREATMENT   Patient Name: Elizabeth Rose MRN: [id number] DOB:1981-03-05, 42 y.o., female Today's Date: 03/07/2023  END OF SESSION:  PT End of Session - 03/07/23 0938     Visit Number 4    Number of Visits 7    Authorization Type BCBS    Authorization Time Period 02/14/23 to 03/28/23    Authorization - Number of Visits 20    PT Start Time 0930    PT Stop Time 1011    PT Time Calculation (min) 41 min    Activity Tolerance Patient tolerated treatment well    Behavior During Therapy [facility] for tasks assessed/performed                History reviewed. No pertinent past medical history. History reviewed. No pertinent surgical history. Patient Active Problem List   Diagnosis Date Noted   Unilateral primary osteoarthritis, right hip 01/30/2023   Obesity 11/08/2022   Symptomatic mammary hypertrophy 06/07/2022   Right hip pain 11/02/2021   Encounter for general adult medical examination with abnormal findings 11/02/2021   Elevated LFTs 06/03/2021   Family history of ovarian carcinoma 06/05/2018    PCP: Hillard Danker MD   REFERRING PROVIDER: Huel Cote, MD  REFERRING DIAG: M16.11 (ICD-10-CM) - Unilateral primary osteoarthritis, right hip  THERAPY DIAG:  Pain in right hip  Muscle weakness (generalized)  Rationale for Evaluation and Treatment: Rehabilitation  ONSET DATE: 02/06/2023  SUBJECTIVE:   SUBJECTIVE STATEMENT:  Hip is about the same, don't notice a big difference one way or the other, still having good and bad days. Can hae a good day, then if I step the wrong way the sharp groin pain comes back. Feels like bone on bone sort of thing if I had to describe it. Seeing Dr. Steward Drone again on the 22nd.    PERTINENT HISTORY: 42 y.o. female with right hip pain consistent with right hip instability.  I did describe that she does have some focal chondral loss involving the femoral head although overall I do believe  this is secondary to as result of her hip instability.  She does distantly remember an injury where she landed directly on her hip when she passed out.  Since this time the hip has been painful with mechanical symptoms.  I do believe that ultimately her labrum tear is the result of this instability.  I would like to have her begin physical therapy at this time for dynamic hip strengthening I will plan to see her back in 6 weeks for reassessment.  She would like to defer injection today. PAIN:  Are you having pain? Yes: NPRS scale: 2/10 Pain location: right groin Pain description: deep, nagging discomfort  Aggravating factors: longer walks  Relieving factors: exercises from PT    PRECAUTIONS: None  WEIGHT BEARING RESTRICTIONS: No  FALLS:  Has patient fallen in last 6 months? No  LIVING ENVIRONMENT: Lives with: lives with their spouse Lives in: House/apartment Stairs: has stairs inside but can live on main floor  Has following equipment at home: None  OCCUPATION: front office of dental office   PLOF: Independent, Independent with basic ADLs, Independent with gait, and Independent with transfers  PATIENT GOALS: "sounds like therapy won't necessarily fix my hip, surgeon said PT is precursor because people who get PT do much better with surgery"   NEXT MD VISIT: Dr. Steward Drone May 22nd   OBJECTIVE:   DIAGNOSTIC FINDINGS: IMPRESSION: 1. Small superior anterior labral tear. 2. Partial-thickness cartilage loss  of the right superior anterior femoral head and acetabulum with small focal areas of high-grade partial-thickness cartilage loss on either side and subchondral cystic changes in the superior anterior acetabulum as well as subchondral marrow edema in the superior anterior femoral head.  PATIENT SURVEYS:  FOTO 33, predicted 26  COGNITION: Overall cognitive status: Within functional limits for tasks assessed     SENSATION: Not tested  EDEMA:   None reported   MUSCLE  LENGTH:  Quads and hamstrings WNL Hamstrings mild limitation B Piriformis WNL B   POSTURE:   PALPATION:   LOWER EXTREMITY ROM:  Active ROM Right eval Left eval  Hip flexion WNL    Hip extension    Hip abduction WNL    Hip adduction    Hip internal rotation WNL    Hip external rotation WNL    Knee flexion    Knee extension    Ankle dorsiflexion    Ankle plantarflexion    Ankle inversion    Ankle eversion     (Blank rows = not tested)  LOWER EXTREMITY MMT:  MMT Right eval Left eval  Hip flexion 4+ 4+  Hip extension 4 4+  Hip abduction 3+ 3  Hip adduction    Hip internal rotation    Hip external rotation    Knee flexion 4+ 4+  Knee extension 4 4  Ankle dorsiflexion 4+ 4+  Ankle plantarflexion    Ankle inversion    Ankle eversion     (Blank rows = not tested)  LOWER EXTREMITY SPECIAL TESTS:    FUNCTIONAL TESTS:    GAIT:     TODAY'S TREATMENT:                                                                                                                              DATE:   03/07/23  TherEx  Nustep L7x6 minutes BLEs only  Shuttle LE press- 100# 2x15 DL, 16# 1W96 B (pain free today), "jumps" on shuttle LE press 75# x10  3 way taps on blue foam pad x10 B Good mornings 15# KB x10  Single LE deadlifts 1# bar for form/caution with CKC hip pain x10 B Staggered stance sit to stands with 15# KB in R hand x10, then L hand x10  Single leg sit to stand x10 B no weights Forward step ups 4 inch box 2x10 (R LE with KB in R hand, then L LE with KB in L hand) Forward step downs 4 inch box x10 B 0#   02/28/23  Education on avoiding irritating activities, difference on load on hip joint with over ground walking versus on TM versus reclining bike; encouraged trying TM walking to see if she notices a difference in pain due to reduced ground reaction forces, if pain is still elevated after would stick to bike for now. Still building strength and stability in hip  mm groups.   TherEx  Nustep L7 x6 minutes BLEs only  Quadruped hip ABD 2x10 B blue TB above knees  Quadruped hip extension 2x10 B blue TB above knees Single leg bridges off of BOSU 2x10 B Goblet squats with 15# KB 2x10  DL with 40# KB 9W11  Shuttle LE press: DL 914# 7W29, SL 56# 2Z30 B, then dropped 62# 1x12 B due to R LE pain with exercise   3 way taps with SLS on blue foam pad x10 B     02/21/23  TherEx  Nustep L7x6 minutes BLEs only  TM belt pushes x12 B for hip extensor strength Single leg bridges x10 B Planks 4x30 seconds Quadruped hip ABD x12 B green TB above knees Quadruped hip extensions x12 B green TB above knees Side planks  on elbow 3x15 seconds B  Shuttle LE press 75# DL 8M57, single leg 84# 6N62  Intro to deadlifts 5# KB x12  focus on form  Intro to good mornings 5# KB x12 focus on form     Eval  Objective measures/appropriate education + HEP   TherEx  Nustep L6 x6 minutes BLEs only Bridges 10x3 with ABD into green TB Sidelying hip ABD green TB x10 B Quadruped hip extensions green TB x10 B Standard plank on forearms 1x20 seconds        PATIENT EDUCATION:  Education details: exam findings, POC, HEP Person educated: Patient Education method: Explanation, Demonstration, and Handouts Education comprehension: verbalized understanding, returned demonstration, and needs further education  HOME EXERCISE PROGRAM:  Access Code: [email or web address] URL: [email or web address] Date: 03/07/2023 Prepared by: Nedra Hai  Exercises - Supine Bridge with Resistance Band  - 2 x daily - 7 x weekly - 1 sets - 10 reps - 3 hold - Sidelying Hip Abduction with Resistance at Thighs  - 2 x daily - 7 x weekly - 1 sets - 10 reps - 1 hold - Quadruped Bent Leg Hip Extension  - 2 x daily - 7 x weekly - 1 sets - 10 reps - 1 hold - Standard Plank  - 2 x daily - 7 x weekly - 1 sets - 3 reps - 20 hold - Single Leg Bridge  - 2 x daily - 7 x weekly - 1 sets - 10 reps - 2  hold - Quadruped Hip Abduction with Resistance Loop  - 2 x daily - 7 x weekly - 1 sets - 10 reps - Goblet Squat with Kettlebell  - 2 x daily - 7 x weekly - 10 reps - 1 hold - Half Deadlift with Kettlebell  - 2 x daily - 7 x weekly - 1 sets - 10 reps - 1 hold - Standing Good Morning with Barbell  - 2 x daily - 7 x weekly - 1 sets - 10 reps - 1 hold - Single Leg Sit to Stand with Arms Crossed  - 2 x daily - 7 x weekly - 3 sets - 10 reps - 1 hold - Forward Step Up  - 2 x daily - 7 x weekly - 1 sets - 10 reps  ASSESSMENT:  CLINICAL IMPRESSION:  [place] arrives today doing OK, still having the same issues with her hip especially if she moves in a "wrong way" (when going from standing to sitting, certain turns when walking, etc). She is seeing Dr. Steward Drone on the 22nd, held off getting objective measures today as we will need to do a progress note next session anyway. Progressed all activities today as tolerated with focus on strength, no increase in pain or aggravation of  symptoms noted today.   OBJECTIVE IMPAIRMENTS: decreased mobility, difficulty walking, decreased strength, and pain.   ACTIVITY LIMITATIONS: standing, squatting, transfers, and locomotion level  PARTICIPATION LIMITATIONS: driving, shopping, community activity, and occupation  PERSONAL FACTORS: Age, Education, Fitness, Past/current experiences, and Time since onset of injury/illness/exacerbation are also affecting patient's functional outcome.   REHAB POTENTIAL: Good  CLINICAL DECISION MAKING: Stable/uncomplicated  EVALUATION COMPLEXITY: Low   GOALS: Goals reviewed with patient? Yes  SHORT TERM GOALS: Target date: 03/07/2023   Will be compliant with appropriate progressive HEP  Baseline: Goal status: MET 02/28/23  2.  Pain to be no more than 6/10 at worst  Baseline:  Goal status: IN PROGRESS 02/28/23 can still flare to 7/10 at times   3.  Frequency of pain flare ups in R hip to have improved by 50%  Baseline:  Goal  status: IN PROGRESS 02/28/23 task dependent but has not changed with aggravating tasks    LONG TERM GOALS: Target date: 03/28/2023    MMT to be 5/5 grossly  Baseline:  Goal status: INITIAL  2.  Pain to be no more than 3/10 at worst  Baseline:  Goal status: INITIAL  3.  Frequency of pain flare ups to have improved by 75% Baseline:  Goal status: INITIAL  4.  Will be able to complete deep squats/crouches, steps, and regular exercise program with pain no more than 3/10 R hip  Baseline:  Goal status: INITIAL    PLAN:  PT FREQUENCY: 1x/week  PT DURATION: 6 weeks  PLANNED INTERVENTIONS: Therapeutic exercises, Therapeutic activity, Neuromuscular re-education, Balance training, Gait training, Patient/Family education, Self Care, Joint mobilization, Stair training, Aquatic Therapy, Dry Needling, Electrical stimulation, Cryotherapy, Moist heat, Taping, Ultrasound, Ionotophoresis 4mg /ml Dexamethasone, Manual therapy, and Re-evaluation  PLAN FOR NEXT SESSION:  progress note/objective measures prior to MD appt May 22nd  Nedra Hai PT DPT PN2

## 2023-03-14 ENCOUNTER — Encounter: Payer: Self-pay | Admitting: Physical Therapy

## 2023-03-14 ENCOUNTER — Ambulatory Visit (INDEPENDENT_AMBULATORY_CARE_PROVIDER_SITE_OTHER): Payer: BC Managed Care – PPO | Admitting: Physical Therapy

## 2023-03-14 DIAGNOSIS — M6281 Muscle weakness (generalized): Secondary | ICD-10-CM

## 2023-03-14 DIAGNOSIS — M25551 Pain in right hip: Secondary | ICD-10-CM | POA: Diagnosis not present

## 2023-03-14 NOTE — Therapy (Signed)
OUTPATIENT PHYSICAL THERAPY LOWER EXTREMITY TREATMENT/DISCHARGE    Patient Name: Elizabeth Rose MRN: [id number] DOB:[birth year], 42 y.o., female Today's Date: 03/14/2023  PHYSICAL THERAPY DISCHARGE SUMMARY  Visits from Start of Care: 5  Current functional level related to goals / functional outcomes: See below    Remaining deficits: See below    Education / Equipment: See below    Patient agrees to discharge. Patient goals were not met. Patient is being discharged due to did not respond to therapy.    END OF SESSION:  PT End of Session - 03/14/23 0836     Visit Number 5    Number of Visits 5    Authorization Type BC[employer]    Authorization Time Period 02/14/23 to 03/28/23    Authorization - Number of Visits 20    PT Start Time 0802    PT Stop Time 0822    PT Time Calculation (min) 20 min    Activity Tolerance Patient tolerated treatment well    Behavior During Therapy [facility] for tasks assessed/performed                 History reviewed. No pertinent past medical history. History reviewed. No pertinent surgical history. Patient Active Problem List   Diagnosis Date Noted   Unilateral primary osteoarthritis, right hip 01/30/2023   Obesity 11/08/2022   Symptomatic mammary hypertrophy 06/07/2022   Right hip pain 11/02/2021   Encounter for general adult medical examination with abnormal findings 11/02/2021   Elevated LFTs 06/03/2021   Family history of ovarian carcinoma 06/05/2018    PCP: Hillard Danker MD   REFERRING PROVIDER: Huel Cote, MD  REFERRING DIAG: M16.11 (ICD-10-CM) - Unilateral primary osteoarthritis, right hip  THERAPY DIAG:  Pain in right hip  Muscle weakness (generalized)  Rationale for Evaluation and Treatment: Rehabilitation  ONSET DATE: 02/06/2023  SUBJECTIVE:   SUBJECTIVE STATEMENT:  Not sure if those single leg sit to stands flared up my hip, its been more angry this week. Was pain free with them at first, then started  hurting more. Took them out of program.     PERTINENT HISTORY: 42 y.o. female with right hip pain consistent with right hip instability.  I did describe that she does have some focal chondral loss involving the femoral head although overall I do believe this is secondary to as result of her hip instability.  She does distantly remember an injury where she landed directly on her hip when she passed out.  Since this time the hip has been painful with mechanical symptoms.  I do believe that ultimately her labrum tear is the result of this instability.  I would like to have her begin physical therapy at this time for dynamic hip strengthening I will plan to see her back in 6 weeks for reassessment.  She would like to defer injection today. PAIN:  Are you having pain? Yes: NPRS scale: 4/10 Pain location: right groin Pain description: deep, nagging discomfort  Aggravating factors: longer walks, some exercises  Relieving factors: some exercises, motrin    PRECAUTIONS: None  WEIGHT BEARING RESTRICTIONS: No  FALLS:  Has patient fallen in last 6 months? No  LIVING ENVIRONMENT: Lives with: lives with their spouse Lives in: House/apartment Stairs: has stairs inside but can live on main floor  Has following equipment at home: None  OCCUPATION: front office of dental office   PLOF: Independent, Independent with basic ADLs, Independent with gait, and Independent with transfers  PATIENT GOALS: "sounds like therapy won't necessarily fix  my hip, surgeon said PT is precursor because people who get PT do much better with surgery"   NEXT MD VISIT: Dr. Steward Drone May 22nd   OBJECTIVE:   DIAGNOSTIC FINDINGS: IMPRESSION: 1. Small superior anterior labral tear. 2. Partial-thickness cartilage loss of the right superior anterior femoral head and acetabulum with small focal areas of high-grade partial-thickness cartilage loss on either side and subchondral cystic changes in the superior anterior acetabulum  as well as subchondral marrow edema in the superior anterior femoral head.  PATIENT SURVEYS:  FOTO 33, predicted 5  COGNITION: Overall cognitive status: Within functional limits for tasks assessed     SENSATION: Not tested  EDEMA:   None reported   MUSCLE LENGTH:  Quads and hamstrings WNL Hamstrings mild limitation B Piriformis WNL B   POSTURE:   PALPATION:   LOWER EXTREMITY ROM:  Active ROM Right eval Left eval  Hip flexion WNL    Hip extension    Hip abduction WNL    Hip adduction    Hip internal rotation WNL    Hip external rotation WNL    Knee flexion    Knee extension    Ankle dorsiflexion    Ankle plantarflexion    Ankle inversion    Ankle eversion     (Blank rows = not tested)  LOWER EXTREMITY MMT:  MMT Right eval Left eval Right 5/17 Left 5/17  Hip flexion 4+ 4+ 5 5  Hip extension 4 4+ 5 5  Hip abduction 3+ 3 4+ 4+  Hip adduction      Hip internal rotation      Hip external rotation      Knee flexion 4+ 4+ 5 5  Knee extension 4 4 4+ 4+  Ankle dorsiflexion 4+ 4+ 4+ 4+  Ankle plantarflexion      Ankle inversion      Ankle eversion       (Blank rows = not tested)  LOWER EXTREMITY SPECIAL TESTS:    FUNCTIONAL TESTS:    GAIT:     TODAY'S TREATMENT:                                                                                                                              DATE:   03/14/23  FOTO 49  Took objective measures/assessed goals/education on POC and that in general if PT was going to significantly help her pain/injury, we would likely have seen more improvement by now- continue with HEP and return to MD      03/07/23  TherEx  Nustep L7x6 minutes BLEs only  Shuttle LE press- 100# 2x15 DL, 11# 9J47 B (pain free today), "jumps" on shuttle LE press 75# x10  3 way taps on blue foam pad x10 B Good mornings 15# KB x10  Single LE deadlifts 1# bar for form/caution with CKC hip pain x10 B Staggered stance sit to stands  with 15# KB in R hand x10, then L hand  x10  Single leg sit to stand x10 B no weights Forward step ups 4 inch box 2x10 (R LE with KB in R hand, then L LE with KB in L hand) Forward step downs 4 inch box x10 B 0#   02/28/23  Education on avoiding irritating activities, difference on load on hip joint with over ground walking versus on TM versus reclining bike; encouraged trying TM walking to see if she notices a difference in pain due to reduced ground reaction forces, if pain is still elevated after would stick to bike for now. Still building strength and stability in hip mm groups.   TherEx  Nustep L7 x6 minutes BLEs only Quadruped hip ABD 2x10 B blue TB above knees  Quadruped hip extension 2x10 B blue TB above knees Single leg bridges off of BOSU 2x10 B Goblet squats with 15# KB 2x10  DL with 16# KB 1W96  Shuttle LE press: DL 045# 4U98, SL 11# 9J47 B, then dropped 62# 1x12 B due to R LE pain with exercise   3 way taps with SLS on blue foam pad x10 B     02/21/23  TherEx  Nustep L7x6 minutes BLEs only  TM belt pushes x12 B for hip extensor strength Single leg bridges x10 B Planks 4x30 seconds Quadruped hip ABD x12 B green TB above knees Quadruped hip extensions x12 B green TB above knees Side planks  on elbow 3x15 seconds B  Shuttle LE press 75# DL 8G95, single leg 62# 1H08  Intro to deadlifts 5# KB x12  focus on form  Intro to good mornings 5# KB x12 focus on form     Eval  Objective measures/appropriate education + HEP   TherEx  Nustep L6 x6 minutes BLEs only Bridges 10x3 with ABD into green TB Sidelying hip ABD green TB x10 B Quadruped hip extensions green TB x10 B Standard plank on forearms 1x20 seconds        PATIENT EDUCATION:  Education details: exam findings, POC, HEP Person educated: Patient Education method: Explanation, Demonstration, and Handouts Education comprehension: verbalized understanding, returned demonstration, and needs further  education  HOME EXERCISE PROGRAM:  Access Code: [email or web address] URL: [email or web address] Date: 03/07/2023 Prepared by: Nedra Hai  Exercises - Supine Bridge with Resistance Band  - 2 x daily - 7 x weekly - 1 sets - 10 reps - 3 hold - Sidelying Hip Abduction with Resistance at Thighs  - 2 x daily - 7 x weekly - 1 sets - 10 reps - 1 hold - Quadruped Bent Leg Hip Extension  - 2 x daily - 7 x weekly - 1 sets - 10 reps - 1 hold - Standard Plank  - 2 x daily - 7 x weekly - 1 sets - 3 reps - 20 hold - Single Leg Bridge  - 2 x daily - 7 x weekly - 1 sets - 10 reps - 2 hold - Quadruped Hip Abduction with Resistance Loop  - 2 x daily - 7 x weekly - 1 sets - 10 reps - Goblet Squat with Kettlebell  - 2 x daily - 7 x weekly - 10 reps - 1 hold - Half Deadlift with Kettlebell  - 2 x daily - 7 x weekly - 1 sets - 10 reps - 1 hold - Standing Good Morning with Barbell  - 2 x daily - 7 x weekly - 1 sets - 10 reps - 1 hold - Single Leg Sit to Stand with Arms Crossed  -  2 x daily - 7 x weekly - 3 sets - 10 reps - 1 hold - Forward Step Up  - 2 x daily - 7 x weekly - 1 sets - 10 reps  ASSESSMENT:  CLINICAL IMPRESSION:  Performed objective re-assessment today and assessed goals and overall progress. She does show some gains in objective strength measures, however per her report frequency of pain and "flare ups" has worsened and increased in general. At this point, given limited progress towards goals as well as lack of significant subjective functional improvement as well as yearly limit on PT visits per her insurance, I am unsure that further PT will be beneficial at this point. Encouraged participation in exercise that does not flare pain (for example, resistance bike instead of long walks) and continuation of current HEP (minus single leg stands). She politely declined further exercises/training in today's PT session.  Will DC to her advanced HEP for now, recommend return to MD for further care  planning.   OBJECTIVE IMPAIRMENTS: decreased mobility, difficulty walking, decreased strength, and pain.   ACTIVITY LIMITATIONS: standing, squatting, transfers, and locomotion level  PARTICIPATION LIMITATIONS: driving, shopping, community activity, and occupation  PERSONAL FACTORS: Age, Education, Fitness, Past/current experiences, and Time since onset of injury/illness/exacerbation are also affecting patient's functional outcome.   REHAB POTENTIAL: Good  CLINICAL DECISION MAKING: Stable/uncomplicated  EVALUATION COMPLEXITY: Low   GOALS: Goals reviewed with patient? Yes  SHORT TERM GOALS: Target date: 03/07/2023   Will be compliant with appropriate progressive HEP  Baseline: Goal status: MET 02/28/23  2.  Pain to be no more than 6/10 at worst  Baseline:  Goal status: PARTIALLY MET 03/14/23 "sometimes yes, sometimes no, depends on task and feel like I've been limiting myself and that is a reason my pain is better"  3.  Frequency of pain flare ups in R hip to have improved by 50%  Baseline:  Goal status: NOT MET 03/14/23- frequency is increasing     LONG TERM GOALS: Target date: 03/28/2023    MMT to be 5/5 grossly  Baseline:  Goal status: NOT MET 03/14/23  2.  Pain to be no more than 3/10 at worst  Baseline:  Goal status: NOT MET 03/14/23  3.  Frequency of pain flare ups to have improved by 75% Baseline:  Goal status: NOT MET 03/14/23  4.  Will be able to complete deep squats/crouches, steps, and regular exercise program with pain no more than 3/10 R hip  Baseline:  Goal status: NOT MET 03/14/23    PLAN:  PT FREQUENCY: 1x/week  PT DURATION: 6 weeks  PLANNED INTERVENTIONS: Therapeutic exercises, Therapeutic activity, Neuromuscular re-education, Balance training, Gait training, Patient/Family education, Self Care, Joint mobilization, Stair training, Aquatic Therapy, Dry Needling, Electrical stimulation, Cryotherapy, Moist heat, Taping, Ultrasound, Ionotophoresis 4mg /ml  Dexamethasone, Manual therapy, and Re-evaluation  PLAN FOR NEXT SESSION:  DC, return to MD for further care planning   Nedra Hai PT DPT PN2

## 2023-03-19 ENCOUNTER — Ambulatory Visit (INDEPENDENT_AMBULATORY_CARE_PROVIDER_SITE_OTHER): Payer: BC Managed Care – PPO | Admitting: Orthopaedic Surgery

## 2023-03-19 ENCOUNTER — Other Ambulatory Visit (HOSPITAL_BASED_OUTPATIENT_CLINIC_OR_DEPARTMENT_OTHER): Payer: Self-pay

## 2023-03-19 DIAGNOSIS — M1611 Unilateral primary osteoarthritis, right hip: Secondary | ICD-10-CM | POA: Diagnosis not present

## 2023-03-19 DIAGNOSIS — S73191A Other sprain of right hip, initial encounter: Secondary | ICD-10-CM

## 2023-03-19 MED ORDER — IBUPROFEN 800 MG PO TABS
800.0000 mg | ORAL_TABLET | Freq: Three times a day (TID) | ORAL | 0 refills | Status: AC
  Filled 2023-03-19 – 2023-03-27 (×2): qty 30, 10d supply, fill #0

## 2023-03-19 MED ORDER — OXYCODONE HCL 5 MG PO TABS
5.0000 mg | ORAL_TABLET | ORAL | 0 refills | Status: DC | PRN
  Filled 2023-03-19 – 2023-03-27 (×2): qty 10, 2d supply, fill #0

## 2023-03-19 MED ORDER — ASPIRIN 325 MG PO TBEC
325.0000 mg | DELAYED_RELEASE_TABLET | Freq: Every day | ORAL | 0 refills | Status: DC
  Filled 2023-03-19 – 2023-03-27 (×2): qty 14, 14d supply, fill #0

## 2023-03-19 MED ORDER — ACETAMINOPHEN 500 MG PO TABS
500.0000 mg | ORAL_TABLET | Freq: Three times a day (TID) | ORAL | 0 refills | Status: AC
  Filled 2023-03-19 – 2023-03-27 (×2): qty 30, 10d supply, fill #0

## 2023-03-19 NOTE — H&P (View-Only) (Signed)
                               Chief Complaint: Right hip pain     History of Present Illness:   03/19/2023: Presents today for follow-up after attending physical therapy on her right hip.  Unfortunately she has not been getting any significant relief.  Elizabeth Rose is a 42 y.o. female presents with right hip pain which has been ongoing now for multiple years and worse over the last several months.  She has been seeing Dr. Blackman who had obtained an MRI of the right hip.  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  She is having a very difficult time sitting.  She does enjoy being active and running although this has been extremely limited as result of her hip and groin base pain.  She is here today for further assessment.  She has not previously participated in physical therapy or had any injections.  She does occasionally feel like the hip will pop and click or give out.  When this shifts that is very painful    Surgical History:   none  PMH/PSH/Family History/Social History/Meds/Allergies:   No past medical history on file. No past surgical history on file. Social History   Socioeconomic History   Marital status: Married    Spouse name: Not on file   Number of children: Not on file   Years of education: Not on file   Highest education level: Not on file  Occupational History   Not on file  Tobacco Use   Smoking status: Never   Smokeless tobacco: Never  Substance and Sexual Activity   Alcohol use: Not on file   Drug use: Not on file   Sexual activity: Not on file  Other Topics Concern   Not on file  Social History Narrative   Not on file   Social Determinants of Health   Financial Resource Strain: Not on file  Food Insecurity: Not on file  Transportation Needs: Not on file  Physical Activity: Not on file  Stress: Not on file  Social Connections: Not on file   Family History  Problem Relation Age of Onset   Ovarian cancer  Mother    Heart failure Father    No Known Allergies Current Outpatient Medications  Medication Sig Dispense Refill   buPROPion (WELLBUTRIN XL) 300 MG 24 hr tablet Take 1 tablet (300 mg total) by mouth daily. 90 tablet 1   No current facility-administered medications for this visit.   No results found.  Review of Systems:   A ROS was performed including pertinent positives and negatives as documented in the HPI.  Physical Exam :   Constitutional: NAD and appears stated age Neurological: Alert and oriented Psych: Appropriate affect and cooperative There were no vitals taken for this visit.   Comprehensive Musculoskeletal Exam:    Inspection Right Left  Skin No atrophy or gross abnormalities appreciated No atrophy or gross abnormalities appreciated  Palpation    Tenderness None None  Crepitus None None  Range of Motion    Flexion (passive) 120 120  Extension 30 30  IR 55 with pain 45  ER 45 50  Strength    Flexion  5/5 5/5  Extension 5/5 5/5  Special Tests    FABER Negative Negative  FADIR Positive Negative  ER Lag/Capsular Insufficiency Negative Negative  Instability Negative Negative    Sacroiliac pain Negative  Negative   Instability    Generalized Laxity No No  Neurologic    sciatic, femoral, obturator nerves intact to light sensation  Vascular/Lymphatic    DP pulse 2+ 2+  Lumbar Exam    Patient has symmetric lumbar range of motion with negative pain referral to hip     Imaging:   Xray (4 views right hip): There is a crossover sign of the cephalad most portion of the acetabulum with a lateral center edge angle 36 degrees  MRI (right hip): There is edema in the femoral head as well as anterior aspect of the acetabulum as well as a 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  I did discuss that there also appears to be a possible foveal ligament tear.  There is a small cartilage lesion involving the femoral head  I personally reviewed and  interpreted the radiographs.   Assessment:   42 y.o. female with right hip pain consistent with right hip instability.  I did describe that she does have some focal chondral loss involving the femoral head although overall I do believe this is secondary to as result of her hip instability.  She does distantly remember an injury where she landed directly on her hip when she passed out.  Since this time the hip has been painful with mechanical symptoms.  She has undergone physical therapy now for total of several months without any relief.  Given this we did discuss the possibility of right hip arthroscopy with labral repair.  I did discuss limitations and restrictions associated with this.  She would like to proceed with this after further discussion Plan :    -Plan for right hip arthroscopy with labral repair   After a lengthy discussion of treatment options, including risks, benefits, alternatives, complications of surgical and nonsurgical conservative options, the patient elected surgical repair.   The patient  is aware of the material risks  and complications including, but not limited to injury to adjacent structures, neurovascular injury, infection, numbness, bleeding, implant failure, thermal burns, stiffness, persistent pain, failure to heal, disease transmission from allograft, need for further surgery, dislocation, anesthetic risks, blood clots, risks of death,and others. The probabilities of surgical success and failure discussed with patient given their particular co-morbidities.The time and nature of expected rehabilitation and recovery was discussed.The patient's questions were all answered preoperatively.  No barriers to understanding were noted. I explained the natural history of the disease process and Rx rationale.  I explained to the patient what I considered to be reasonable expectations given their personal situation.  The final treatment plan was arrived at through a shared patient  decision making process model.      I personally saw and evaluated the patient, and participated in the management and treatment plan.  Naraya Stoneberg, MD Attending Physician, Orthopedic Surgery  This document was dictated using Dragon voice recognition software. A reasonable attempt at proof reading has been made to minimize errors. 

## 2023-03-19 NOTE — Progress Notes (Signed)
Chief Complaint: Right hip pain     History of Present Illness:   03/19/2023: Presents today for follow-up after attending physical therapy on her right hip.  Unfortunately she has not been getting any significant relief.  Elizabeth Rose is a 42 y.o. female presents with right hip pain which has been ongoing now for multiple years and worse over the last several months.  She has been seeing Dr. Magnus Ivan who had obtained an MRI of the right hip.  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  She is having a very difficult time sitting.  She does enjoy being active and running although this has been extremely limited as result of her hip and groin base pain.  She is here today for further assessment.  She has not previously participated in physical therapy or had any injections.  She does occasionally feel like the hip will pop and click or give out.  When this shifts that is very painful    Surgical History:   none  PMH/PSH/Family History/Social History/Meds/Allergies:   No past medical history on file. No past surgical history on file. Social History   Socioeconomic History   Marital status: Married    Spouse name: Not on file   Number of children: Not on file   Years of education: Not on file   Highest education level: Not on file  Occupational History   Not on file  Tobacco Use   Smoking status: Never   Smokeless tobacco: Never  Substance and Sexual Activity   Alcohol use: Not on file   Drug use: Not on file   Sexual activity: Not on file  Other Topics Concern   Not on file  Social History Narrative   Not on file   Social Determinants of Health   Financial Resource Strain: Not on file  Food Insecurity: Not on file  Transportation Needs: Not on file  Physical Activity: Not on file  Stress: Not on file  Social Connections: Not on file   Family History  Problem Relation Age of Onset   Ovarian cancer  Mother    Heart failure Father    No Known Allergies Current Outpatient Medications  Medication Sig Dispense Refill   buPROPion (WELLBUTRIN XL) 300 MG 24 hr tablet Take 1 tablet (300 mg total) by mouth daily. 90 tablet 1   No current facility-administered medications for this visit.   No results found.  Review of Systems:   A ROS was performed including pertinent positives and negatives as documented in the HPI.  Physical Exam :   Constitutional: NAD and appears stated age Neurological: Alert and oriented Psych: Appropriate affect and cooperative There were no vitals taken for this visit.   Comprehensive Musculoskeletal Exam:    Inspection Right Left  Skin No atrophy or gross abnormalities appreciated No atrophy or gross abnormalities appreciated  Palpation    Tenderness None None  Crepitus None None  Range of Motion    Flexion (passive) 120 120  Extension 30 30  IR 55 with pain 45  ER 45 50  Strength    Flexion  5/5 5/5  Extension 5/5 5/5  Special Tests    FABER Negative Negative  FADIR Positive Negative  ER Lag/Capsular Insufficiency Negative Negative  Instability Negative Negative  Sacroiliac pain Negative  Negative   Instability    Generalized Laxity No No  Neurologic    sciatic, femoral, obturator nerves intact to light sensation  Vascular/Lymphatic    DP pulse 2+ 2+  Lumbar Exam    Patient has symmetric lumbar range of motion with negative pain referral to hip     Imaging:   Xray (4 views right hip): There is a crossover sign of the cephalad most portion of the acetabulum with a lateral center edge angle 36 degrees  MRI (right hip): There is edema in the femoral head as well as anterior aspect of the acetabulum as well as a 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  I did discuss that there also appears to be a possible foveal ligament tear.  There is a small cartilage lesion involving the femoral head  I personally reviewed and  interpreted the radiographs.   Assessment:   42 y.o. female with right hip pain consistent with right hip instability.  I did describe that she does have some focal chondral loss involving the femoral head although overall I do believe this is secondary to as result of her hip instability.  She does distantly remember an injury where she landed directly on her hip when she passed out.  Since this time the hip has been painful with mechanical symptoms.  She has undergone physical therapy now for total of several months without any relief.  Given this we did discuss the possibility of right hip arthroscopy with labral repair.  I did discuss limitations and restrictions associated with this.  She would like to proceed with this after further discussion Plan :    -Plan for right hip arthroscopy with labral repair   After a lengthy discussion of treatment options, including risks, benefits, alternatives, complications of surgical and nonsurgical conservative options, the patient elected surgical repair.   The patient  is aware of the material risks  and complications including, but not limited to injury to adjacent structures, neurovascular injury, infection, numbness, bleeding, implant failure, thermal burns, stiffness, persistent pain, failure to heal, disease transmission from allograft, need for further surgery, dislocation, anesthetic risks, blood clots, risks of death,and others. The probabilities of surgical success and failure discussed with patient given their particular co-morbidities.The time and nature of expected rehabilitation and recovery was discussed.The patient's questions were all answered preoperatively.  No barriers to understanding were noted. I explained the natural history of the disease process and Rx rationale.  I explained to the patient what I considered to be reasonable expectations given their personal situation.  The final treatment plan was arrived at through a shared patient  decision making process model.      I personally saw and evaluated the patient, and participated in the management and treatment plan.  Huel Cote, MD Attending Physician, Orthopedic Surgery  This document was dictated using Dragon voice recognition software. A reasonable attempt at proof reading has been made to minimize errors.

## 2023-03-21 ENCOUNTER — Ambulatory Visit (HOSPITAL_BASED_OUTPATIENT_CLINIC_OR_DEPARTMENT_OTHER): Payer: BC Managed Care – PPO | Admitting: Orthopaedic Surgery

## 2023-03-25 ENCOUNTER — Telehealth: Payer: Self-pay | Admitting: Orthopaedic Surgery

## 2023-03-25 NOTE — Telephone Encounter (Signed)
Patient's surgery is 04/01/23, and she has not been contacted in regards to a brace. Please call to advise.

## 2023-03-25 NOTE — Telephone Encounter (Signed)
Contacted Elizabeth Rose with [employer]

## 2023-03-26 ENCOUNTER — Other Ambulatory Visit (HOSPITAL_BASED_OUTPATIENT_CLINIC_OR_DEPARTMENT_OTHER): Payer: Self-pay

## 2023-03-27 ENCOUNTER — Ambulatory Visit (HOSPITAL_BASED_OUTPATIENT_CLINIC_OR_DEPARTMENT_OTHER): Payer: Self-pay | Admitting: Orthopaedic Surgery

## 2023-03-27 ENCOUNTER — Other Ambulatory Visit (HOSPITAL_BASED_OUTPATIENT_CLINIC_OR_DEPARTMENT_OTHER): Payer: Self-pay

## 2023-03-27 DIAGNOSIS — S73191A Other sprain of right hip, initial encounter: Secondary | ICD-10-CM

## 2023-03-27 NOTE — Pre-Procedure Instructions (Addendum)
Surgical Instructions    Your procedure is scheduled on Tuesday, June 4th.  Report to [facility] Main Entrance "A" at 10:30 A.M., then check in with the Admitting office.  Call this number if you have problems the morning of surgery:  [phone]  If you have any questions prior to your surgery date call [phone]: Open Monday-Friday 8am-4pm If you experience any cold or flu symptoms such as cough, fever, chills, shortness of breath, etc. between now and your scheduled surgery, please notify us at the above number.     Remember:  Do not eat after midnight the night before your surgery  You may drink clear liquids until 09:30 AM the morning of your surgery.   Clear liquids allowed are: Water, Non-Citrus Juices (without pulp), Carbonated Beverages, Clear Tea, Black Coffee Only (NO MILK, CREAM OR POWDERED CREAMER of any kind), and Gatorade.   Patient Instructions  The night before surgery:  No food after midnight. ONLY clear liquids after midnight  The day of surgery (if you do NOT have diabetes):  Drink ONE (1) Pre-Surgery Clear Ensure by 09:30 AM the morning of surgery. Drink in one sitting. Do not sip.  This drink was given to you during your hospital  pre-op appointment visit.  Nothing else to drink after completing the  Pre-Surgery Clear Ensure.          If you have questions, please contact your surgeon's office.     Take these medicines the morning of surgery with A SIP OF WATER  acetaminophen (TYLENOL)  buPROPion (WELLBUTRIN XL)    If needed: Glycerin-Hypromellose-PEG 400 (DRY EYE RELIEF DROPS OP)  oxyCODONE (ROXICODONE)    Follow your surgeon's instructions on when to stop Aspirin.  If no instructions were given by your surgeon then you will need to call the office to get those instructions.     As of today, STOP taking any Aleve, Naproxen, Ibuprofen, Motrin, Advil, Goody's, BC's, all herbal medications, fish oil, and all vitamins.                     Do  NOT Smoke (Tobacco/Vaping) for 24 hours prior to your procedure.  If you use a CPAP at night, you may bring your mask/headgear for your overnight stay.   Contacts, glasses, piercing's, hearing aid's, dentures or partials may not be worn into surgery, please bring cases for these belongings.    For patients admitted to the hospital, discharge time will be determined by your treatment team.   Patients discharged the day of surgery will not be allowed to drive home, and someone needs to stay with them for 24 hours.  SURGICAL WAITING ROOM VISITATION Patients having surgery or a procedure may have no more than 2 support people in the waiting area - these visitors may rotate.   Children under the age of 17 must have an adult with them who is not the patient. If the patient needs to stay at the hospital during part of their recovery, the visitor guidelines for inpatient rooms apply. Pre-op nurse will coordinate an appropriate time for 1 support person to accompany patient in pre-op.  This support person may not rotate.   Please refer to the [facility] website for the visitor guidelines for Inpatients (after your surgery is over and you are in a regular room).    Special instructions:   [facility]- Preparing For Surgery  Before surgery, you can play an important role. Because skin is not sterile, your skin  needs to be as free of germs as possible. You can reduce the number of germs on your skin by washing with CHG (chlorahexidine gluconate) Soap before surgery.  CHG is an antiseptic cleaner which kills germs and bonds with the skin to continue killing germs even after washing.    Oral Hygiene is also important to reduce your risk of infection.  Remember - BRUSH YOUR TEETH THE MORNING OF SURGERY WITH YOUR REGULAR TOOTHPASTE  Please do not use if you have an allergy to CHG or antibacterial soaps. If your skin becomes reddened/irritated stop using the CHG.  Do not shave (including legs and  underarms) for at least 48 hours prior to first CHG shower. It is OK to shave your face.  Please follow these instructions carefully.   Shower the NIGHT BEFORE SURGERY and the MORNING OF SURGERY  If you chose to wash your hair, wash your hair first as usual with your normal shampoo.  After you shampoo, rinse your hair and body thoroughly to remove the shampoo.  Use CHG Soap as you would any other liquid soap. You can apply CHG directly to the skin and wash gently with a scrungie or a clean washcloth.   Apply the CHG Soap to your body ONLY FROM THE NECK DOWN.  Do not use on open wounds or open sores. Avoid contact with your eyes, ears, mouth and genitals (private parts). Wash Face and genitals (private parts)  with your normal soap.   Wash thoroughly, paying special attention to the area where your surgery will be performed.  Thoroughly rinse your body with warm water from the neck down.  DO NOT shower/wash with your normal soap after using and rinsing off the CHG Soap.  Pat yourself dry with a CLEAN TOWEL.  Wear CLEAN PAJAMAS to bed the night before surgery  Place CLEAN SHEETS on your bed the night before your surgery  DO NOT SLEEP WITH PETS.   Day of Surgery: Take a shower with CHG soap. Do not wear jewelry or makeup Do not wear lotions, powders, perfumes, or deodorant. Do not shave 48 hours prior to surgery.   Do not bring valuables to the hospital. [facility] is not responsible for any belongings or valuables. Do not wear nail polish, gel polish, artificial nails, or any other type of covering on natural nails (fingers and toes) If you have artificial nails or gel coating that need to be removed by a nail salon, please have this removed prior to surgery. Artificial nails or gel coating may interfere with anesthesia's ability to adequately monitor your vital signs. Wear Clean/Comfortable clothing the morning of surgery Remember to brush your teeth WITH YOUR REGULAR  TOOTHPASTE.   Please read over the following fact sheets that you were given.    If you received a COVID test during your pre-op visit  it is requested that you wear a mask when out in public, stay away from anyone that may not be feeling well and notify your surgeon if you develop symptoms. If you have been in contact with anyone that has tested positive in the last 10 days please notify you surgeon.

## 2023-03-28 ENCOUNTER — Encounter (HOSPITAL_COMMUNITY): Payer: Self-pay

## 2023-03-28 ENCOUNTER — Other Ambulatory Visit: Payer: Self-pay

## 2023-03-28 ENCOUNTER — Encounter (HOSPITAL_COMMUNITY)
Admission: RE | Admit: 2023-03-28 | Discharge: 2023-03-28 | Disposition: A | Discharge status: Home / Self Care | Payer: BC Managed Care – PPO | Source: Ambulatory Visit | Attending: Orthopaedic Surgery | Admitting: Orthopaedic Surgery

## 2023-03-28 VITALS — BP 118/80 | HR 65 | Temp 98.5°F | Resp 17 | Ht 61.0 in | Wt 157.0 lb

## 2023-03-28 DIAGNOSIS — Z01812 Encounter for preprocedural laboratory examination: Secondary | ICD-10-CM | POA: Diagnosis present

## 2023-03-28 DIAGNOSIS — Z01818 Encounter for other preprocedural examination: Secondary | ICD-10-CM

## 2023-03-28 HISTORY — DX: Other sprain of unspecified hip, initial encounter: S73.199A

## 2023-03-28 LAB — CBC
HCT: 40.3 % (ref 36.0–46.0)
Hemoglobin: 13.3 g/dL (ref 12.0–15.0)
MCH: 29 pg (ref 26.0–34.0)
MCHC: 33 g/dL (ref 30.0–36.0)
MCV: 87.8 fL (ref 80.0–100.0)
Platelets: 272 10*3/uL (ref 150–400)
RBC: 4.59 MIL/uL (ref 3.87–5.11)
RDW: 12 % (ref 11.5–15.5)
WBC: 7.5 10*3/uL (ref 4.0–10.5)
nRBC: 0 % (ref 0.0–0.2)

## 2023-03-28 NOTE — Progress Notes (Signed)
PCP - Dr. Hillard Danker Cardiologist - denies  PPM/ICD - denies   Chest x-ray - 06/02/21 EKG - 06/02/21 Stress Test - denies ECHO - 06/03/21 Cardiac Cath - denies  Sleep Study - denies   DM- denies  Blood Thinner Instructions: n/a Aspirin Instructions: pt not taking yet until after surgery  ERAS Protcol - yes PRE-SURGERY Ensure given at PAT  COVID TEST- n/a   Anesthesia review: no  Patient denies shortness of breath, fever, cough and chest pain at PAT appointment   All instructions explained to the patient, with a verbal understanding of the material. Patient agrees to go over the instructions while at home for a better understanding. The opportunity to ask questions was provided.

## 2023-03-31 ENCOUNTER — Ambulatory Visit (HOSPITAL_BASED_OUTPATIENT_CLINIC_OR_DEPARTMENT_OTHER): Payer: Self-pay | Admitting: Orthopaedic Surgery

## 2023-03-31 DIAGNOSIS — S73191A Other sprain of right hip, initial encounter: Secondary | ICD-10-CM

## 2023-03-31 NOTE — Progress Notes (Signed)
Patient verbalized understanding of new arrival time of 1220 tomorrow and ERAS/ clear liquids okay until 1120.

## 2023-04-01 ENCOUNTER — Ambulatory Visit (HOSPITAL_COMMUNITY): Payer: BC Managed Care – PPO

## 2023-04-01 ENCOUNTER — Encounter (HOSPITAL_COMMUNITY): Payer: Self-pay | Admitting: Orthopaedic Surgery

## 2023-04-01 ENCOUNTER — Encounter (HOSPITAL_COMMUNITY)
Admission: RE | Disposition: A | Discharge status: Home / Self Care | Payer: Self-pay | Source: Ambulatory Visit | Attending: Orthopaedic Surgery

## 2023-04-01 ENCOUNTER — Ambulatory Visit (HOSPITAL_COMMUNITY)
Admission: RE | Admit: 2023-04-01 | Discharge: 2023-04-01 | Disposition: A | Discharge status: Home / Self Care | Payer: BC Managed Care – PPO | Source: Ambulatory Visit | Attending: Orthopaedic Surgery | Admitting: Orthopaedic Surgery

## 2023-04-01 ENCOUNTER — Other Ambulatory Visit: Payer: Self-pay

## 2023-04-01 DIAGNOSIS — M25351 Other instability, right hip: Secondary | ICD-10-CM | POA: Diagnosis present

## 2023-04-01 DIAGNOSIS — X58XXXA Exposure to other specified factors, initial encounter: Secondary | ICD-10-CM | POA: Diagnosis not present

## 2023-04-01 DIAGNOSIS — Z01818 Encounter for other preprocedural examination: Secondary | ICD-10-CM

## 2023-04-01 DIAGNOSIS — S73191A Other sprain of right hip, initial encounter: Secondary | ICD-10-CM

## 2023-04-01 DIAGNOSIS — M1990 Unspecified osteoarthritis, unspecified site: Secondary | ICD-10-CM | POA: Insufficient documentation

## 2023-04-01 HISTORY — PX: HIP ARTHROSCOPY W/ LABRAL REPAIR: SHX1750

## 2023-04-01 LAB — POCT PREGNANCY, URINE: Preg Test, Ur: NEGATIVE

## 2023-04-01 SURGERY — ARTHROSCOPY, HIP, WITH LABRUM REPAIR
Anesthesia: General | Site: Hip | Laterality: Right

## 2023-04-01 MED ORDER — AMISULPRIDE (ANTIEMETIC) 5 MG/2ML IV SOLN
INTRAVENOUS | Status: AC
  Filled 2023-04-01: qty 4

## 2023-04-01 MED ORDER — AMISULPRIDE (ANTIEMETIC) 5 MG/2ML IV SOLN
10.0000 mg | Freq: Once | INTRAVENOUS | Status: AC
  Administered 2023-04-01: 10 mg via INTRAVENOUS

## 2023-04-01 MED ORDER — SODIUM CHLORIDE 0.9 % IR SOLN
Status: DC | PRN
  Administered 2023-04-01: 3000 mL

## 2023-04-01 MED ORDER — FENTANYL CITRATE (PF) 250 MCG/5ML IJ SOLN
INTRAMUSCULAR | Status: AC
  Filled 2023-04-01: qty 5

## 2023-04-01 MED ORDER — FENTANYL CITRATE (PF) 250 MCG/5ML IJ SOLN
INTRAMUSCULAR | Status: DC | PRN
  Administered 2023-04-01: 25 ug via INTRAVENOUS
  Administered 2023-04-01: 50 ug via INTRAVENOUS

## 2023-04-01 MED ORDER — PROPOFOL 10 MG/ML IV BOLUS
INTRAVENOUS | Status: DC | PRN
  Administered 2023-04-01: 170 mg via INTRAVENOUS

## 2023-04-01 MED ORDER — TRANEXAMIC ACID-NACL 1000-0.7 MG/100ML-% IV SOLN
1000.0000 mg | INTRAVENOUS | Status: AC
  Administered 2023-04-01: 1000 mg via INTRAVENOUS
  Filled 2023-04-01: qty 100

## 2023-04-01 MED ORDER — MIDAZOLAM HCL 2 MG/2ML IJ SOLN
INTRAMUSCULAR | Status: DC | PRN
  Administered 2023-04-01: 2 mg via INTRAVENOUS

## 2023-04-01 MED ORDER — CEFAZOLIN SODIUM-DEXTROSE 2-4 GM/100ML-% IV SOLN
2.0000 g | INTRAVENOUS | Status: AC
  Administered 2023-04-01: 2 g via INTRAVENOUS
  Filled 2023-04-01: qty 100

## 2023-04-01 MED ORDER — ROCURONIUM BROMIDE 10 MG/ML (PF) SYRINGE
PREFILLED_SYRINGE | INTRAVENOUS | Status: DC | PRN
  Administered 2023-04-01: 50 mg via INTRAVENOUS

## 2023-04-01 MED ORDER — OXYCODONE HCL 5 MG PO TABS
ORAL_TABLET | ORAL | Status: AC
  Filled 2023-04-01: qty 1

## 2023-04-01 MED ORDER — OXYCODONE HCL 5 MG PO TABS
5.0000 mg | ORAL_TABLET | Freq: Once | ORAL | Status: AC
  Administered 2023-04-01: 5 mg via ORAL

## 2023-04-01 MED ORDER — GABAPENTIN 300 MG PO CAPS
300.0000 mg | ORAL_CAPSULE | Freq: Once | ORAL | Status: AC
  Administered 2023-04-01: 300 mg via ORAL
  Filled 2023-04-01: qty 1

## 2023-04-01 MED ORDER — FENTANYL CITRATE (PF) 100 MCG/2ML IJ SOLN
25.0000 ug | INTRAMUSCULAR | Status: DC | PRN
  Administered 2023-04-01: 50 ug via INTRAVENOUS

## 2023-04-01 MED ORDER — LACTATED RINGERS IV SOLN: INTRAVENOUS | Status: DC

## 2023-04-01 MED ORDER — BUPIVACAINE HCL 0.25 % IJ SOLN
INTRAMUSCULAR | Status: DC | PRN
  Administered 2023-04-01: 30 mL

## 2023-04-01 MED ORDER — DEXMEDETOMIDINE HCL IN NACL 80 MCG/20ML IV SOLN
INTRAVENOUS | Status: DC | PRN
  Administered 2023-04-01 (×2): 8 ug via INTRAVENOUS

## 2023-04-01 MED ORDER — 0.9 % SODIUM CHLORIDE (POUR BTL) OPTIME
TOPICAL | Status: DC | PRN
  Administered 2023-04-01: 1000 mL

## 2023-04-01 MED ORDER — FENTANYL CITRATE (PF) 100 MCG/2ML IJ SOLN
INTRAMUSCULAR | Status: AC
  Filled 2023-04-01: qty 2

## 2023-04-01 MED ORDER — BUPIVACAINE HCL (PF) 0.25 % IJ SOLN
INTRAMUSCULAR | Status: AC
  Filled 2023-04-01: qty 30

## 2023-04-01 MED ORDER — ACETAMINOPHEN 500 MG PO TABS
1000.0000 mg | ORAL_TABLET | Freq: Once | ORAL | Status: AC
  Administered 2023-04-01: 1000 mg via ORAL
  Filled 2023-04-01: qty 2

## 2023-04-01 MED ORDER — PROPOFOL 10 MG/ML IV BOLUS
INTRAVENOUS | Status: AC
  Filled 2023-04-01: qty 20

## 2023-04-01 MED ORDER — LIDOCAINE 2% (20 MG/ML) 5 ML SYRINGE
INTRAMUSCULAR | Status: DC | PRN
  Administered 2023-04-01: 60 mg via INTRAVENOUS

## 2023-04-01 MED ORDER — SUGAMMADEX SODIUM 200 MG/2ML IV SOLN
INTRAVENOUS | Status: DC | PRN
  Administered 2023-04-01: 200 mg via INTRAVENOUS

## 2023-04-01 MED ORDER — MIDAZOLAM HCL 2 MG/2ML IJ SOLN
INTRAMUSCULAR | Status: AC
  Filled 2023-04-01: qty 2

## 2023-04-01 MED ORDER — ORAL CARE MOUTH RINSE: 15.0000 mL | Freq: Once | OROMUCOSAL | Status: AC

## 2023-04-01 MED ORDER — DEXAMETHASONE SODIUM PHOSPHATE 10 MG/ML IJ SOLN
INTRAMUSCULAR | Status: DC | PRN
  Administered 2023-04-01: 5 mg via INTRAVENOUS

## 2023-04-01 MED ORDER — EPINEPHRINE PF 1 MG/ML IJ SOLN
INTRAMUSCULAR | Status: AC
  Filled 2023-04-01: qty 2

## 2023-04-01 MED ORDER — SODIUM CHLORIDE (PF) 0.9 % IJ SOLN
INTRAMUSCULAR | Status: DC | PRN
  Administered 2023-04-01 (×2): 3001 mL

## 2023-04-01 MED ORDER — CHLORHEXIDINE GLUCONATE 0.12 % MT SOLN
15.0000 mL | Freq: Once | OROMUCOSAL | Status: AC
  Administered 2023-04-01: 15 mL via OROMUCOSAL
  Filled 2023-04-01: qty 15

## 2023-04-01 MED ORDER — ONDANSETRON HCL 4 MG/2ML IJ SOLN
INTRAMUSCULAR | Status: DC | PRN
  Administered 2023-04-01: 4 mg via INTRAVENOUS

## 2023-04-01 SURGICAL SUPPLY — 63 items
ANCH SUT .5 CRC TPR CT 40X40 (SUTURE) ×1
ANCH SUT 1.4 SUT TPE BLK/WHT (SUTURE) ×1
ANCH SUT 25D 1.4 FLXINSRTR (Anchor) ×3 IMPLANT
ANCHOR SUT 1.4 FLEX (Anchor) IMPLANT
APL PRP STRL LF DISP 70% ISPRP (MISCELLANEOUS) ×1
BAG COUNTER SPONGE SURGICOUNT (BAG) IMPLANT
BAG SPNG CNTER NS LX DISP (BAG)
BIT DRILL FLEX NANOTACK (BIT) IMPLANT
BLADE SAMURAI STR FULL RADIUS (BLADE) IMPLANT
BLADE SURG 11 STRL SS (BLADE) ×2 IMPLANT
BUR ROUND HI FLUTE 8 4X19 (BURR) ×2 IMPLANT
BURR ROUND HI FLUTE 8 4X19 (BURR) ×1
CANNULA OBTURATOR FLOWPORT ST5 (CANNULA) IMPLANT
CHLORAPREP W/TINT 26 (MISCELLANEOUS) ×2 IMPLANT
COOLER ICEMAN CLASSIC (MISCELLANEOUS) IMPLANT
DISSECTOR 4.2MMX19CM HL (MISCELLANEOUS) ×2 IMPLANT
DRAPE C-ARM 42X72 X-RAY (DRAPES) ×2 IMPLANT
DRAPE STERI IOBAN 125X83 (DRAPES) ×2 IMPLANT
DRAPE U-SHAPE 47X51 STRL (DRAPES) ×4 IMPLANT
DRSG TEGADERM 4X4.75 (GAUZE/BANDAGES/DRESSINGS) ×4 IMPLANT
DRSG XEROFORM 1X8 (GAUZE/BANDAGES/DRESSINGS) IMPLANT
GAUZE SPONGE 4X4 12PLY STRL (GAUZE/BANDAGES/DRESSINGS) ×2 IMPLANT
GLOVE BIO SURGEON STRL SZ7.5 (GLOVE) ×2 IMPLANT
GLOVE BIOGEL PI IND STRL 6.5 (GLOVE) ×2 IMPLANT
GLOVE BIOGEL PI IND STRL 8 (GLOVE) ×2 IMPLANT
GLOVE ECLIPSE 6.0 STRL STRAW (GLOVE) ×2 IMPLANT
GOWN STRL REUS W/ TWL LRG LVL3 (GOWN DISPOSABLE) ×4 IMPLANT
GOWN STRL REUS W/ TWL XL LVL3 (GOWN DISPOSABLE) ×2 IMPLANT
GOWN STRL REUS W/TWL LRG LVL3 (GOWN DISPOSABLE) ×2
GOWN STRL REUS W/TWL XL LVL3 (GOWN DISPOSABLE) ×1
KIT BASIN OR (CUSTOM PROCEDURE TRAY) ×2 IMPLANT
KIT HIP ARTHROSCOPY (ORTHOPEDIC DISPOSABLE SUPPLIES) ×2 IMPLANT
KIT PATIENT POSITION MEDIUM (KITS) IMPLANT
KIT PORTAL ENTRY HIP ACCESS (KITS) IMPLANT
KIT TURNOVER KIT B (KITS) ×2 IMPLANT
MANIFOLD NEPTUNE II (INSTRUMENTS) ×4 IMPLANT
NDL 18GX1X1/2 (RX/OR ONLY) (NEEDLE) IMPLANT
NDL HYPO 22X1.5 SAFETY MO (MISCELLANEOUS) IMPLANT
NDL INJECTOR II CARTRIDGE (MISCELLANEOUS) IMPLANT
NDL SPNL 18GX3.5 QUINCKE PK (NEEDLE) ×2 IMPLANT
NEEDLE 18GX1X1/2 (RX/OR ONLY) (NEEDLE) ×1 IMPLANT
NEEDLE HYPO 22X1.5 SAFETY MO (MISCELLANEOUS) ×1 IMPLANT
NEEDLE INJECTOR II CARTRIDGE (MISCELLANEOUS) ×1 IMPLANT
NEEDLE SPNL 18GX3.5 QUINCKE PK (NEEDLE) ×1 IMPLANT
PACK BASIC III (CUSTOM PROCEDURE TRAY) ×1
PACK SRG BSC III STRL LF ECLPS (CUSTOM PROCEDURE TRAY) ×2 IMPLANT
PAD ARMBOARD 7.5X6 YLW CONV (MISCELLANEOUS) ×4 IMPLANT
PAD COLD SHLDR WRAP-ON (PAD) IMPLANT
PASSER SUT 1.5D CRESCENT (INSTRUMENTS) IMPLANT
PASSER SUT 70D UP ANGLED (INSTRUMENTS) IMPLANT
SPIKE FLUID TRANSFER (MISCELLANEOUS) ×2 IMPLANT
SPONGE T-LAP 18X18 ~~LOC~~+RFID (SPONGE) ×2 IMPLANT
SUT ETHILON 3 0 PS 1 (SUTURE) IMPLANT
SUT FIBERWIRE #2 38 T-5 BLUE (SUTURE)
SUT XBRAID 1.4 BLK/WHT (SUTURE) IMPLANT
SUT XBRAID 1.4 BLUE (SUTURE) IMPLANT
SUTURE FIBERWR #2 38 T-5 BLUE (SUTURE) IMPLANT
SYR 3ML LL SCALE MARK (SYRINGE) IMPLANT
SYR 50ML LL SCALE MARK (SYRINGE) ×2 IMPLANT
TOWEL GREEN STERILE (TOWEL DISPOSABLE) ×2 IMPLANT
TUBE CONNECTING 12X1/4 (SUCTIONS) ×4 IMPLANT
TUBING ARTHROSCOPY IRRIG 16FT (MISCELLANEOUS) ×2 IMPLANT
WATER STERILE IRR 1000ML POUR (IV SOLUTION) ×2 IMPLANT

## 2023-04-01 NOTE — Transfer of Care (Signed)
Immediate Anesthesia Transfer of Care Note  Patient: Elizabeth Rose  Procedure(s) Performed: RIGHT HIP ARTHROSCOPY WITH LABRAL REPAIR (Right: Hip)  Patient Location: PACU  Anesthesia Type:General  Level of Consciousness: awake, drowsy, patient cooperative, and responds to stimulation  Airway & Oxygen Therapy: Patient Spontanous Breathing and Patient connected to nasal cannula oxygen  Post-op Assessment: Report given to RN and Post -op Vital signs reviewed and stable  Post vital signs: Reviewed and stable  Last Vitals:  Vitals Value Taken Time  BP 122/63 04/01/23 1627  Temp    Pulse 55 04/01/23 1629  Resp 18 04/01/23 1629  SpO2 100 % 04/01/23 1629  Vitals shown include unvalidated device data.  Last Pain:  Vitals:   04/01/23 1221  TempSrc:   PainSc: 0-No pain      Patients Stated Pain Goal: 0 (04/01/23 1221)  Complications: No notable events documented.

## 2023-04-01 NOTE — Progress Notes (Signed)
Orthopedic Tech Progress Note Patient Details:  Elizabeth Rose 1981/07/03 [id number]  PACU RN called requesting a pair o CRUTCHES  Ortho Devices Type of Ortho Device: Crutches Ortho Device/Splint Interventions: Ordered, Application, Adjustment   Post Interventions Patient Tolerated: Well Instructions Provided: Care of device  Donald Pore 04/01/2023, 6:06 PM

## 2023-04-01 NOTE — Brief Op Note (Signed)
   Brief Op Note  Date of Surgery: 04/01/2023  Preoperative Diagnosis: RIGHT HIP LABRAL TEAR  Postoperative Diagnosis: same  Procedure: Procedure(s): RIGHT HIP ARTHROSCOPY WITH LABRAL REPAIR  Implants: Implant Name Type Inv. Item Serial No. Manufacturer Lot No. LRB No. Used Action  ANCHOR SUT 1.4 FLEX - [id number] Anchor ANCHOR SUT 1.4 FLEX  [employer] [id number] Right 1 Implanted  ANCHOR SUT 1.4 FLEX - [id number] Anchor ANCHOR SUT 1.4 FLEX  [employer] [id number] Right 1 Implanted    Surgeons: Surgeon(s): Huel Cote, MD  Anesthesia: General    Estimated Blood Loss: See anesthesia record  Complications: None  Condition to PACU: Stable  Benancio Deeds, MD 04/01/2023 4:19 PM

## 2023-04-01 NOTE — Anesthesia Preprocedure Evaluation (Addendum)
Anesthesia Evaluation  Patient identified by MRN, date of birth, ID band Patient awake    Reviewed: Allergy & Precautions, NPO status , Patient's Chart, lab work & pertinent test results  Airway Mallampati: III  TM Distance: >3 FB Neck ROM: Full    Dental no notable dental hx. (+) Teeth Intact, Dental Advisory Given   Pulmonary neg pulmonary ROS   Pulmonary exam normal breath sounds clear to auscultation       Cardiovascular negative cardio ROS Normal cardiovascular exam Rhythm:Regular Rate:Normal     Neuro/Psych negative neurological ROS  negative psych ROS   GI/Hepatic negative GI ROS, Neg liver ROS,,,  Endo/Other  negative endocrine ROS    Renal/GU negative Renal ROS  negative genitourinary   Musculoskeletal  (+) Arthritis ,    Abdominal   Peds  Hematology negative hematology ROS (+)   Anesthesia Other Findings   Reproductive/Obstetrics                             Anesthesia Physical Anesthesia Plan  ASA: 2  Anesthesia Plan: General   Post-op Pain Management: Tylenol PO (pre-op)*   Induction: Intravenous  PONV Risk Score and Plan: 3 and Midazolam, Dexamethasone and Ondansetron  Airway Management Planned: Oral ETT  Additional Equipment:   Intra-op Plan:   Post-operative Plan: Extubation in OR  Informed Consent: I have reviewed the patients History and Physical, chart, labs and discussed the procedure including the risks, benefits and alternatives for the proposed anesthesia with the patient or authorized representative who has indicated his/her understanding and acceptance.     Dental advisory given  Plan Discussed with: CRNA  Anesthesia Plan Comments:        Anesthesia Quick Evaluation

## 2023-04-01 NOTE — Discharge Instructions (Signed)
     Discharge Instructions    Attending Surgeon: Huel Cote, MD Office Phone Number: [phone]   Diagnosis and Procedures:    Surgeries Performed: Right hip labral repair  Discharge Plan:    Diet: Resume usual diet. Begin with light or bland foods.  Drink plenty of fluids.  Activity:  Right hip touchdown weight bearing. You are advised to go home directly from the hospital or surgical center. Restrict your activities.  GENERAL INSTRUCTIONS: 1.  Please apply ice to your wound to help with swelling and inflammation. This will improve your comfort and your overall recovery following surgery.     2. Please call Dr. Serena Croissant office at [phone] with questions Monday-Friday during business hours. If no one answers, please leave a message and someone should get back to the patient within 24 hours. For emergencies please call 911 or proceed to the emergency room.   3. Patient to notify surgical team if experiences any of the following: Bowel/Bladder dysfunction, uncontrolled pain, nerve/muscle weakness, incision with increased drainage or redness, nausea/vomiting and Fever greater than 101.0 F.  Be alert for signs of infection including redness, streaking, odor, fever or chills. Be alert for excessive pain or bleeding and notify your surgeon immediately.  WOUND INSTRUCTIONS:   Leave your dressing, cast, or splint in place until your post operative visit.  Keep it clean and dry.  Always keep the incision clean and dry until the staples/sutures are removed. If there is no drainage from the incision you should keep it open to air. If there is drainage from the incision you must keep it covered at all times until the drainage stops  Do not soak in a bath tub, hot tub, pool, lake or other body of water until 21 days after your surgery and your incision is completely dry and healed.  If you have removable sutures (or staples) they must be removed 10-14 days (unless otherwise  instructed) from the day of your surgery.     1)  Elevate the extremity as much as possible.  2)  Keep the dressing clean and dry.  3)  Please call us if the dressing becomes wet or dirty.  4)  If you are experiencing worsening pain or worsening swelling, please call.     MEDICATIONS: Resume all previous home medications at the previous prescribed dose and frequency unless otherwise noted Start taking the  pain medications on an as-needed basis as prescribed  Please taper down pain medication over the next week following surgery.  Ideally you should not require a refill of any narcotic pain medication.  Take pain medication with food to minimize nausea. In addition to the prescribed pain medication, you may take over-the-counter pain relievers such as Tylenol.  Do NOT take additional tylenol if your pain medication already has tylenol in it.  Aspirin 325mg  daily for four weeks.      FOLLOWUP INSTRUCTIONS: 1. Follow up at the Physical Therapy Clinic 3-4 days following surgery. This appointment should be scheduled unless other arrangements have been made.The Physical Therapy scheduling number is [phone] if an appointment has not already been arranged.  2. Contact Dr. Serena Croissant office during office hours at [phone] or the practice after hours line at [phone] for non-emergencies. For medical emergencies call 911.   Discharge Location: Home

## 2023-04-01 NOTE — Interval H&P Note (Signed)
History and Physical Interval Note:  04/01/2023 12:43 PM  Elizabeth Rose  has presented today for surgery, with the diagnosis of RIGHT HIP LABRAL TEAR.  The various methods of treatment have been discussed with the patient and family. After consideration of risks, benefits and other options for treatment, the patient has consented to  Procedure(s): RIGHT Arthroscopy Hip with Labral Repair (Right) as a surgical intervention.  The patient's history has been reviewed, patient examined, no change in status, stable for surgery.  I have reviewed the patient's chart and labs.  Questions were answered to the patient's satisfaction.     Huel Cote

## 2023-04-01 NOTE — Anesthesia Procedure Notes (Signed)
Procedure Name: Intubation Date/Time: 04/01/2023 2:58 PM  Performed by: Aundria Rud, CRNAPre-anesthesia Checklist: Patient identified, Emergency Drugs available, Suction available and Patient being monitored Patient Re-evaluated:Patient Re-evaluated prior to induction Oxygen Delivery Method: Circle System Utilized Preoxygenation: Pre-oxygenation with 100% oxygen Induction Type: IV induction Ventilation: Mask ventilation without difficulty Laryngoscope Size: Mac and 3 Grade View: Grade I Tube type: Oral Tube size: 7.0 mm Number of attempts: 1 Airway Equipment and Method: Stylet and Oral airway Placement Confirmation: ETT inserted through vocal cords under direct vision, positive ETCO2 and breath sounds checked- equal and bilateral Secured at: 22 cm Tube secured with: Tape Dental Injury: Teeth and Oropharynx as per pre-operative assessment

## 2023-04-01 NOTE — Op Note (Signed)
Date of Surgery: 04/01/2023  INDICATIONS: Ms. Behen is a 42 y.o.-year-old female with right hip instability with labral tear.  The risk and benefits of the procedure were discussed in detail and documented in the pre-operative evaluation.   PREOPERATIVE DIAGNOSIS: 1. Right hip labral tear  POSTOPERATIVE DIAGNOSIS: Same.  PROCEDURE: 1. Right hip labral repair  SURGEON: Benancio Deeds MD  ASSISTANT: Kerby Less, ATC  ANESTHESIA:  general  IV FLUIDS AND URINE: See anesthesia record.  ANTIBIOTICS: Ancef  ESTIMATED BLOOD LOSS: 10 mL.  IMPLANTS:  Implant Name Type Inv. Item Serial No. Manufacturer Lot No. LRB No. Used Action  ANCHOR SUT 1.4 FLEX - [id number] Anchor ANCHOR SUT 1.4 FLEX  [employer] [id number] Right 1 Implanted  ANCHOR SUT 1.4 FLEX - [id number] Anchor ANCHOR SUT 1.4 FLEX  [employer] [id number] Right 1 Implanted    DRAINS: None  CULTURES: None  COMPLICATIONS: none  DESCRIPTION OF PROCEDURE:  Cartilage Intact femoral and acetabular cartilage. Focal chondral thinning at the acetabular head at the ligament teres insertion as well as fovea   Labrum Hypertrophic appearing   Boundaries of labral tear Convention (3 o'clock anterior, 9 o'clock posterior) Anterior boundary: 3 o'clock Posterior boundary: 1 o'clock   OPERATIVE REPORT:  The patient was brought to the operating room, placed supine on the operating table, and bony prominences were padded.  The traction boots were applied with padding to ensure that safe traction could be applied through the feet.  The contralateral limb was abducted maximally and light traction was applied.  The operative leg was brought into neutral position.  The flouroscopic c-arm was brought between the legs for an AP image.  The patient was prepped and draped in a sterile fashion.  Time-out was performed and landmarks were identified. Traction was obtained and care was taken to ensure the least amount of force necessary to  allow safe access to the joint of 8-37mm.  This was checked with fluoroscopy.    Next we placed an anterolateral portal under the assistance of fluoroscopy.  First, fluoroscopy was used to estimate the trajectory and starting point.  A 5mm incision with a #11 blade was made and a straight hemostat was used to dilate the portal through the appropriate tract.  We then placed a 14-gauge hypodermic needle with careful technique to be as close to the femoral head as possible and parallel to the sorcele to ensure no iatrogenic damage to the labrum.  This released the negative pressure environment and the amount of traction was adjusted to maintain the 8-76mm of distraction.  A nitinol wire was placed through the needle and flouroscopy was used to ensure it extended to the medial wall of the acetabulum.  The Flowport from [employer] was placed over the wire and the nitinol wire was retracted to just inside the capsule during insertion of the dilator and cannula to minimize the risk of breakage. The arthroscope was placed next and we visualized the anterior triangle.     We then placed the anterior portal under direct visualization using the technique described above.  This was safely placed as well without damage to the labrum or femoral head.  We then switched our arthroscope to the anterior portal to ensure we were not through the labrum - we were safely through the capsule only.  We then proceeded with a transverse capsulotomy connecting the 2 portals in the same plane utilizing the Samurai blade from [employer].  We identified the anterior inferior  iliac spine proximally, the psoas tendon medially and the rectus tendon laterally as landmarks.  We then proceeded with a diagnostic arthroscopy - the results can be found in the findings section above.    We then used the 50 degree hip specific radiofrequency device from [employer]. and a 4mm shaver to clear the superior acetabulum and expose the  subspinous region.  Next we exposed the acetabular rim leaving the chondral labral junction intact.  Working from both portals, the acetabular rim/subspinous region was reshaped with 5.5 mm bur consistent with the preoperative three-dimensional imaging.   When adequate reshaping was obtained we then proceeded with the labral repair. We placed 2 anchors at the 1:00 and 2:30 positions. The sutures were passed using the crescent Nanopass from [employer].  This resulted in anatomic labral repair.  We debrided the loose cartilage at the rim and residual degenerative labral tissue.  Traction was let down with total traction time of 48 minutes.     Finally, we performed a complete capsular closure with tape suture.  She was replaced in the anterior and posterior limb of the reported capsulotomy with excellent apposition. We then removed the arthroscope and closed the incisions with 3-0 nylon simple stitches.  A sterile dressing was applied..  The patient was awakened from anesthesia and transferred to PACU in stable condition. Postoperative care includes:       POSTOPERATIVE PLAN:    2 weeks touchdown weight bearing, hip labral repair protocol Formal physical therapy will begin this week. ASA 325 Daily for DVT prophylaxis     Benancio Deeds, MD 4:20 PM

## 2023-04-02 NOTE — Anesthesia Postprocedure Evaluation (Signed)
Anesthesia Post Note  Patient: Elener Obst  Procedure(s) Performed: RIGHT HIP ARTHROSCOPY WITH LABRAL REPAIR (Right: Hip)     Patient location during evaluation: PACU Anesthesia Type: General Level of consciousness: awake and alert Pain management: pain level controlled Vital Signs Assessment: post-procedure vital signs reviewed and stable Respiratory status: spontaneous breathing, nonlabored ventilation, respiratory function stable and patient connected to nasal cannula oxygen Cardiovascular status: blood pressure returned to baseline and stable Postop Assessment: no apparent nausea or vomiting Anesthetic complications: no  No notable events documented.  Last Vitals:  Vitals:   04/01/23 1657 04/01/23 1715  BP: 122/75 124/80  Pulse: (!) 51 77  Resp: 13 16  Temp:  36.7 C  SpO2: 100% 99%    Last Pain:  Vitals:   04/01/23 1627  TempSrc:   PainSc: Asleep                 Hisham Provence L Sheleen Conchas

## 2023-04-03 ENCOUNTER — Encounter (HOSPITAL_BASED_OUTPATIENT_CLINIC_OR_DEPARTMENT_OTHER): Payer: Self-pay | Admitting: Orthopaedic Surgery

## 2023-04-07 ENCOUNTER — Ambulatory Visit (HOSPITAL_BASED_OUTPATIENT_CLINIC_OR_DEPARTMENT_OTHER): Payer: BC Managed Care – PPO | Attending: Orthopaedic Surgery | Admitting: Physical Therapy

## 2023-04-07 DIAGNOSIS — M6281 Muscle weakness (generalized): Secondary | ICD-10-CM | POA: Diagnosis present

## 2023-04-07 DIAGNOSIS — M1611 Unilateral primary osteoarthritis, right hip: Secondary | ICD-10-CM | POA: Insufficient documentation

## 2023-04-07 DIAGNOSIS — M25551 Pain in right hip: Secondary | ICD-10-CM | POA: Diagnosis present

## 2023-04-07 DIAGNOSIS — M25651 Stiffness of right hip, not elsewhere classified: Secondary | ICD-10-CM | POA: Insufficient documentation

## 2023-04-07 DIAGNOSIS — R262 Difficulty in walking, not elsewhere classified: Secondary | ICD-10-CM | POA: Diagnosis present

## 2023-04-07 NOTE — Therapy (Signed)
OUTPATIENT PHYSICAL THERAPY LOWER EXTREMITY EVALUATION   Patient Name: Elizabeth Rose MRN: [id number] DOB:10-Jul-1981, 42 y.o., female Today's Date: 04/08/2023  END OF SESSION:  PT End of Session - 04/07/23 1500     Visit Number 1    Number of Visits 28    Date for PT Re-Evaluation 06/30/23    Authorization Type BCBS    PT Start Time 1505    PT Stop Time 1608    PT Time Calculation (min) 63 min    Activity Tolerance Patient tolerated treatment well;No increased pain    Behavior During Therapy [facility] for tasks assessed/performed             Past Medical History:  Diagnosis Date   Labral tear of hip joint    right   Past Surgical History:  Procedure Laterality Date   CHOLECYSTECTOMY     Patient Active Problem List   Diagnosis Date Noted   Tear of right acetabular labrum 04/01/2023   Unilateral primary osteoarthritis, right hip 01/30/2023   Obesity 11/08/2022   Symptomatic mammary hypertrophy 06/07/2022   Right hip pain 11/02/2021   Encounter for general adult medical examination with abnormal findings 11/02/2021   Elevated LFTs 06/03/2021   Family history of ovarian carcinoma 06/05/2018     REFERRING PROVIDER: Huel Cote, MD  REFERRING DIAG: M16.11 (ICD-10-CM) - Unilateral primary osteoarthritis, right hip  post op R hip labral repair   THERAPY DIAG:  Pain in right hip  Stiffness of right hip, not elsewhere classified  Muscle weakness (generalized)  Difficulty in walking, not elsewhere classified  Rationale for Evaluation and Treatment: Rehabilitation  ONSET DATE: 04/01/2023 DOS  SUBJECTIVE:   SUBJECTIVE STATEMENT: She has been having pain for a couple of years.  Pt reports pain began when she had covid and was dehydrated in August 2022.  She passed out and landed on her R side.  Pt states her pain progressively worsened. Pt had an x ray and MRI.  MRI showed a labral tear consistent with anterior hip instability and cartilage loss.  Pt was seen in PT  in April and May and had increased pain.  She returned to MD and underwent R hip labral repair on 04/01/23.  Post op plan indicated 2 weeks TDWB'ing, hip labral repair protocol and to start formal PT this week.    Pt reports her calf has been bothering her.  Pt is limited with her normal functional mobility skills including ambulation, transfers, and stairs.  Pt is ambulating with bilat crutches and has been adhering to Wb'ing restrictions.  Pt enjoys walking and is unable to perform her walking program.  Pt is unable to perform her work activities and workout activities.       PERTINENT HISTORY: R hip labral repair on 04/01/2023.  Pt is TDWB'ing x 2 weeks.  PAIN:  Are you having pain? Yes 2/10 current, 4/10 worst, 0/10 Location:  R hip  PRECAUTIONS: Other: per surgical protocol  WEIGHT BEARING RESTRICTIONS: Yes TDWB'ing x 2 weeks  FALLS:  Has patient fallen in last 6 months? 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    PLOF: Independent.  Pt was able to perform her ADLs/IADLs and functional mobility skills independently.  Pt ambulated without an AD.  Pt was walking  2-4 miles 5x/wk.  Pt was also performing strength training.   PATIENT GOALS: return to her PLOF.  To be able to perform her walking program.   NEXT MD VISIT: 04/16/2023  OBJECTIVE:   DIAGNOSTIC FINDINGS: Pt is post op.  She did have an x ray and MRI prior to surgery.  Pt did have some cartilage loss per MRI.   PATIENT SURVEYS:  FOTO 18 with a goal of 60 at visit #21.  COGNITION: Overall cognitive status: Within functional limits for tasks assessed      OBSERVATION:  Pt reports her calf has been bothering her.  PT assessed R calf.  Pt had no signs of inflammation in calf including no redness.  She had generalized  tenderness t/o calf and soft tissue tightness was present.   Pt had a negative homan's test.   PT changed her post operative dressings.  PT removed the tegaderm and gauze.  Pt had xeroform gauze over incisions with stitches present.  Pt had expected post op drainage, nothing abnormal.  She had no signs of infection.  PT left xeroform gauze in place and applied new gauze and tegaderm over incisions.  PT educated pt concerning dressings.     LOWER EXTREMITY ROM:  Not tested today.  LOWER EXTREMITY MMT:  Strength not tested due to healing constraints and post op protocol.    GAIT: Assistive device utilized: Crutches Level of assistance:  Independent Comments:  Pt ambulated with bilat crutches with a step to gait pattern adhering to TDWB'ing restrictions.   TODAY'S TREATMENT:                                                                                                                                 Pt performed ankle pumps x 20 reps, supine quad set with 10 sec hold, and supine TrA contractions with and without 5 sec hold.  Pt had no c/o's with exercises.  PT instructed pt in correct positions for exercises and she should not have pain with HEP.  Pt received a HEP handout and was educated in correct form and appropriate frequency.                 PT instructed pt in proper gait with crutches and TDWB'ing.               See below for pt education.    PATIENT EDUCATION:  Education details:  PT provided education concerning surgical protocol restrictions and limitations including positions to avoid, not sitting for more than 20-30 minutes, and not lifting her leg on its own (SLR).  Gait training, Wb'ing restrictions/MD orders, post op limitations and expectations, dressings, POC, dx, relevant anatomy, and HEP.  PT instructed pt in using ice.  Person educated: Patient  Education method: Explanation, Demonstration, Tactile cues, Verbal cues, and Handouts Education comprehension: verbalized  understanding, returned demonstration, verbal cues required, tactile cues required  HOME EXERCISE PROGRAM: Access Code: [email or web address] URL: [email or web address] Date:  04/07/2023 Prepared by: Aaron Edelman  Exercises - Supine Quadricep Sets  - 2 x daily - 7 x weekly - 2 sets - 10 reps - 5 seconds hold - ANKLE PUMPS  - 3-5 x daily - 7 x weekly - 2-3 sets - 10 reps - Supine Transversus Abdominis Bracing - Hands on Stomach  - 2 x daily - 7 x weekly - 2 sets - 10 reps - 5 seconds hold  ASSESSMENT:  CLINICAL IMPRESSION: Patient is a 42 y.o. female 6 days s/p R hip labral repair presenting to the clinic with R hip pain, joint stiffness in R hip, muscle weakness in R LE, and difficulty in walking.  Pt is limited with her normal functional mobility skills and daily activities including ambulation, transfers, and stairs.  Pt is ambulating with bilat crutches with TDWB'ing restrictions.  Pt is unable to perform her work activities and normal workout activities.  PT changed post op dressings today and pt had no signs of infection.  Pt should benefit from skilled PT services per protocol to address impairments and to assist in restoring PLOF.      OBJECTIVE IMPAIRMENTS: Abnormal gait, decreased activity tolerance, decreased mobility, difficulty walking, decreased ROM, decreased strength, hypomobility, impaired flexibility, and pain.   ACTIVITY LIMITATIONS: lifting, bending, standing, squatting, stairs, transfers, dressing, and locomotion level  PARTICIPATION LIMITATIONS: meal prep, cleaning, laundry, driving, shopping, community activity, and occupation  PERSONAL FACTORS:    REHAB POTENTIAL: Good  CLINICAL DECISION MAKING: Stable/uncomplicated  EVALUATION COMPLEXITY: Low   GOALS:   SHORT TERM GOALS:  Pt will be independent and compliant with HEP for improved pain, strength, ROM, and function.  Baseline: Goal status: INITIAL Target date:  05/05/2023   2.  Pt will progress with hip  PROM per protocol for improved stiffness and mobility.   Baseline:  Goal status: INITIAL Target date:  05/12/2023  3.   Pt will wean off of crutches and out of brace without adverse effects.  Baseline:  Goal status: INITIAL Target date:  05/13/2023  4.  Pt will progress with exercises per protocol without adverse effects for improved strength and mobility.  Baseline:  Goal status: INITIAL Target date: 05/26/2023   5. Pt will perform a 6 inch step up with good form and good control without UE support.   Baseline:  Goal status: INITIAL Target date:  05/27/2023   6.  Pt will have no pain and demo good form with squatting for improved function strength and to assist with functional mobility.  Baseline:  Goal status: INITIAL Target date: 06/24/2023  7.  Pt will ambulate with a normalized heel to toe gait pattern without limping.  Baseline:  Goal status: INITIAL Target date:  06/17/2023  8.  Pt will be able to perform work activities without adverse effects.  Baseline:  Goal status: INITIAL Target date: 06/17/2023      LONG TERM GOALS: Target date: 07/28/2023   Pt's L hip AROM will be [facility] for improved stiffness and daily mobility.  Baseline:  Goal status: INITIAL  2.   Pt will ambulate extended community distance without increased pain and significant difficulty.  Baseline:  Goal status: INITIAL  3.  Pt will be able to ascend and descend stairs with a reciprocal gait with good control.  Baseline: INITIAL   4.  Pt will be able to perform her normal standing activities and household chores without significant pain.   Baseline: INITIAL  5.  Pt will demo 4+/5 strength in L  hip flex, abd, ext and knee ext/flex for improved performance of and tolerance with functional mobility  Baseline:  Goal status: INITIAL     PLAN:  PT FREQUENCY: 1x/wk for 3-4 weeks and 2x/wk afterwards.  PT DURATION: other: 16 weeks  PLANNED INTERVENTIONS: Therapeutic exercises, Therapeutic  activity, Neuromuscular re-education, Balance training, Gait training, Patient/Family education, Self Care, Joint mobilization, Stair training, DME instructions, Aquatic Therapy, Dry Needling, Electrical stimulation, Spinal mobilization, Cryotherapy, Moist heat, scar mobilization, Taping, Ultrasound, Manual therapy, and Re-evaluation  PLAN FOR NEXT SESSION: Cont per Dr. Serena Croissant hip labral repair protocol.     Audie Clear III PT, DPT 04/08/23 2:37 PM

## 2023-04-08 ENCOUNTER — Ambulatory Visit (INDEPENDENT_AMBULATORY_CARE_PROVIDER_SITE_OTHER): Payer: BC Managed Care – PPO | Admitting: Student

## 2023-04-08 ENCOUNTER — Encounter (HOSPITAL_BASED_OUTPATIENT_CLINIC_OR_DEPARTMENT_OTHER): Payer: Self-pay | Admitting: Physical Therapy

## 2023-04-08 ENCOUNTER — Other Ambulatory Visit: Payer: Self-pay

## 2023-04-08 ENCOUNTER — Encounter (HOSPITAL_BASED_OUTPATIENT_CLINIC_OR_DEPARTMENT_OTHER): Payer: Self-pay | Admitting: Orthopaedic Surgery

## 2023-04-08 DIAGNOSIS — M79661 Pain in right lower leg: Secondary | ICD-10-CM

## 2023-04-08 DIAGNOSIS — S73191A Other sprain of right hip, initial encounter: Secondary | ICD-10-CM

## 2023-04-08 NOTE — Telephone Encounter (Signed)
Called patient and she stated she has pain in her R calf (s/p hip arthroscopy 1 week) at rest and her calf is tender but massaging it helps. Pain has been present for 3-4 days. Saw PT yesterday with no concern for DVT.  No redness or full feeling, describes tenderness as muscle tightness. Pain subsides with activity like walking.  I scheduled her to come in and see you this afternoon at 5 to rule out DVT.

## 2023-04-08 NOTE — Progress Notes (Signed)
Post Operative Evaluation    Procedure/Date of Surgery: Right hip arthroscopy with labral repair 04/01/2023  Interval History:   Patient is 1 week status post the above procedure.  Has been doing very well and started with physical therapy yesterday.  She does note some pain in her right calf which has been ongoing for a few days and has been slowly worsening.  She has been taking aspirin as instructed.  Patient states that the pain is worse with dorsiflexion of the ankle.  Denies any notable swelling, redness, or warmth of the calf.  Denies chest pain and shortness of breath.  Reports that her sister is on chronic anticoagulation for past history of DVT/PE.   PMH/PSH/Family History/Social History/Meds/Allergies:    Past Medical History:  Diagnosis Date   Labral tear of hip joint    right   Past Surgical History:  Procedure Laterality Date   CHOLECYSTECTOMY     Social History   Socioeconomic History   Marital status: Married    Spouse name: Not on file   Number of children: 2   Years of education: Not on file   Highest education level: Not on file  Occupational History   Not on file  Tobacco Use   Smoking status: Never   Smokeless tobacco: Never  Vaping Use   Vaping Use: Never used  Substance and Sexual Activity   Alcohol use: Yes    Alcohol/week: 1.0 standard drink of alcohol    Types: 1 Glasses of wine per week    Comment: Socially   Drug use: Never   Sexual activity: Not on file  Other Topics Concern   Not on file  Social History Narrative   Not on file   Social Determinants of Health   Financial Resource Strain: Not on file  Food Insecurity: Not on file  Transportation Needs: Not on file  Physical Activity: Not on file  Stress: Not on file  Social Connections: Not on file   Family History  Problem Relation Age of Onset   Ovarian cancer Mother    Heart failure Father    No Known Allergies Current Outpatient  Medications  Medication Sig Dispense Refill   aspirin EC 325 MG tablet Take 1 tablet (325 mg total) by mouth daily. 14 tablet 0   betamethasone dipropionate 0.05 % cream Apply 1 Application topically 2 (two) times daily.     buPROPion (WELLBUTRIN XL) 300 MG 24 hr tablet Take 1 tablet (300 mg total) by mouth daily. 90 tablet 1   clindamycin (CLEOCIN T) 1 % SWAB Apply 1 Application topically daily.     Glycerin-Hypromellose-PEG 400 (DRY EYE RELIEF DROPS OP) Place 1 drop into both eyes daily as needed (Dry eye).     Multiple Vitamins-Minerals (MULTIVITAMIN WITH MINERALS) tablet Take 1 tablet by mouth daily.     oxyCODONE (ROXICODONE) 5 MG immediate release tablet Take 1 tablet (5 mg total) by mouth every 4 (four) hours as needed for severe pain or breakthrough pain. (Patient not taking: Reported on 04/08/2023) 10 tablet 0   No current facility-administered medications for this visit.   No results found.  Review of Systems:   A ROS was performed including pertinent positives and negatives as documented in the HPI.   Musculoskeletal Exam:    There were no vitals taken  for this visit.  Left lower leg does not appear swollen or erythematous compared to contralateral side.  There is some tenderness in the middle posterior calf with palpation.  Not warm to touch.  Positive Homan's sign.  Imaging:     Assessment:   Patient presents today with 1 week status post right hip labral repair.  She reports that her hip is doing well however pain in the right calf is her main cause of pain and concern at the moment.  At this point I would recommend obtaining an ultrasound to completely rule out DVT.  She is at a slightly higher risk due to recent surgery on ipsilateral side.  Rule out would allow to continue progressing with physical therapy as planned.  Plan :    -Refer for Doppler ultrasound to rule out DVT      I personally saw and evaluated the patient, and participated in the management and  treatment plan.  Hazle Nordmann, PA-C Orthopedics  This document was dictated using Conservation officer, historic buildings. A reasonable attempt at proof reading has been made to minimize errors.

## 2023-04-09 ENCOUNTER — Ambulatory Visit (INDEPENDENT_AMBULATORY_CARE_PROVIDER_SITE_OTHER): Payer: BC Managed Care – PPO

## 2023-04-09 DIAGNOSIS — M79661 Pain in right lower leg: Secondary | ICD-10-CM

## 2023-04-14 ENCOUNTER — Ambulatory Visit (HOSPITAL_BASED_OUTPATIENT_CLINIC_OR_DEPARTMENT_OTHER): Payer: BC Managed Care – PPO | Admitting: Physical Therapy

## 2023-04-14 ENCOUNTER — Other Ambulatory Visit: Payer: Self-pay

## 2023-04-14 ENCOUNTER — Encounter (HOSPITAL_BASED_OUTPATIENT_CLINIC_OR_DEPARTMENT_OTHER): Payer: Self-pay | Admitting: Physical Therapy

## 2023-04-14 DIAGNOSIS — M25651 Stiffness of right hip, not elsewhere classified: Secondary | ICD-10-CM

## 2023-04-14 DIAGNOSIS — M25551 Pain in right hip: Secondary | ICD-10-CM

## 2023-04-14 DIAGNOSIS — R262 Difficulty in walking, not elsewhere classified: Secondary | ICD-10-CM

## 2023-04-14 DIAGNOSIS — M6281 Muscle weakness (generalized): Secondary | ICD-10-CM

## 2023-04-14 NOTE — Therapy (Signed)
OUTPATIENT PHYSICAL THERAPY LOWER EXTREMITY EVALUATION   Patient Name: Elizabeth Rose MRN: [id number] DOB:04/07/1981, 42 y.o., female Today's Date: 04/14/2023  END OF SESSION:  PT End of Session - 04/14/23 1226     Visit Number 2    Number of Visits 28    Date for PT Re-Evaluation 06/30/23    Authorization Type BCBS    PT Start Time 1147    PT Stop Time 1218    PT Time Calculation (min) 31 min    Activity Tolerance Patient tolerated treatment well;No increased pain    Behavior During Therapy [facility] for tasks assessed/performed              Past Medical History:  Diagnosis Date   Labral tear of hip joint    right   Past Surgical History:  Procedure Laterality Date   CHOLECYSTECTOMY     Patient Active Problem List   Diagnosis Date Noted   Tear of right acetabular labrum 04/01/2023   Unilateral primary osteoarthritis, right hip 01/30/2023   Obesity 11/08/2022   Symptomatic mammary hypertrophy 06/07/2022   Right hip pain 11/02/2021   Encounter for general adult medical examination with abnormal findings 11/02/2021   Elevated LFTs 06/03/2021   Family history of ovarian carcinoma 06/05/2018     REFERRING PROVIDER: Huel Cote, MD  REFERRING DIAG: M16.11 (ICD-10-CM) - Unilateral primary osteoarthritis, right hip  post op R hip labral repair   THERAPY DIAG:  Pain in right hip  Stiffness of right hip, not elsewhere classified  Muscle weakness (generalized)  Difficulty in walking, not elsewhere classified  Rationale for Evaluation and Treatment: Rehabilitation  ONSET DATE: 04/01/2023 DOS  SUBJECTIVE:   SUBJECTIVE STATEMENT: Denies pain in hip, calf pain has decreased.    PERTINENT HISTORY: R hip labral repair on 04/01/2023.  Pt is TDWB'ing x 2 weeks.  PAIN:  Are you having pain? Yes 2/10 current, 4/10 worst, 0/10 Location:  R hip  PRECAUTIONS: Other: per surgical protocol  WEIGHT BEARING RESTRICTIONS: Yes TDWB'ing x 2 weeks  FALLS:  Has patient  fallen in last 6 months? 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    PLOF: Independent.  Pt was able to perform her ADLs/IADLs and functional mobility skills independently.  Pt ambulated without an AD.  Pt was walking  2-4 miles 5x/wk.  Pt was also performing strength training.   PATIENT GOALS: return to her PLOF.  To be able to perform her walking program.   NEXT MD VISIT: 04/16/2023  OBJECTIVE:   DIAGNOSTIC FINDINGS: Pt is post op.  She did have an x ray and MRI prior to surgery.  Pt did have some cartilage loss per MRI.   PATIENT SURVEYS:  FOTO 18 with a goal of 60 at visit #21.  COGNITION: Overall cognitive status: Within functional limits for tasks assessed      OBSERVATION:  Pt reports her calf has been bothering her.  PT assessed R calf.  Pt had no signs of inflammation in calf including no redness.  She had generalized tenderness t/o calf and soft tissue tightness was present.   Pt had a negative homan's test.   PT changed her post operative dressings.  PT removed the tegaderm and gauze.  Pt had xeroform gauze over incisions with stitches present.  Pt had  expected post op drainage, nothing abnormal.  She had no signs of infection.  PT left xeroform gauze in place and applied new gauze and tegaderm over incisions.  PT educated pt concerning dressings.     LOWER EXTREMITY ROM:  Not tested today.  LOWER EXTREMITY MMT:  Strength not tested due to healing constraints and post op protocol.    GAIT: Assistive device utilized: Crutches Level of assistance:  Independent Comments:  Pt ambulated with bilat crutches with a step to gait pattern adhering to TDWB'ing restrictions.   TODAY'S TREATMENT:                                                                                                                                  Treatment                            6/17:  Recumb bike 5 min Prone position, added glut sets, added alt HS curls with ab set Sidelying clams Standing weight shift Gait training with crutches- heel toe, glut set, upright posture   PATIENT EDUCATION:  Education details:  PT provided education concerning surgical protocol restrictions and limitations including positions to avoid, not sitting for more than 20-30 minutes, and not lifting her leg on its own (SLR).  Gait training, Wb'ing restrictions/MD orders, post op limitations and expectations, dressings, POC, dx, relevant anatomy, and HEP.  PT instructed pt in using ice.  Person educated: Patient  Education method: Explanation, Demonstration, Tactile cues, Verbal cues, and Handouts Education comprehension: verbalized understanding, returned demonstration, verbal cues required, tactile cues required  HOME EXERCISE PROGRAM: Access Code: [email or web address] URL: [email or web address]   ASSESSMENT:  CLINICAL IMPRESSION: Progressing very well. Will begin using her upright bike at home for short periods of time.   OBJECTIVE IMPAIRMENTS: Abnormal gait, decreased activity tolerance, decreased mobility, difficulty walking, decreased ROM, decreased strength, hypomobility, impaired flexibility, and pain.   ACTIVITY LIMITATIONS: lifting, bending, standing, squatting, stairs, transfers, dressing, and locomotion level  PARTICIPATION LIMITATIONS: meal prep, cleaning, laundry, driving, shopping, community activity, and occupation  PERSONAL FACTORS:    REHAB POTENTIAL: Good  CLINICAL DECISION MAKING: Stable/uncomplicated  EVALUATION COMPLEXITY: Low   GOALS:   SHORT TERM GOALS:  Pt will be independent and compliant with HEP for improved pain, strength, ROM, and function.  Baseline: Goal status: INITIAL Target date:  05/05/2023   2.  Pt will progress with hip PROM per protocol for improved  stiffness and mobility.   Baseline:  Goal status: INITIAL Target date:  05/12/2023  3.   Pt will wean off of crutches and out of brace without adverse effects.  Baseline:  Goal status: INITIAL Target date:  05/13/2023  4.  Pt will progress with exercises per protocol without adverse effects for improved strength and mobility.  Baseline:  Goal status: INITIAL Target date: 05/26/2023   5. Pt will perform a 6 inch step up with  good form and good control without UE support.   Baseline:  Goal status: INITIAL Target date:  05/27/2023   6.  Pt will have no pain and demo good form with squatting for improved function strength and to assist with functional mobility.  Baseline:  Goal status: INITIAL Target date: 06/24/2023  7.  Pt will ambulate with a normalized heel to toe gait pattern without limping.  Baseline:  Goal status: INITIAL Target date:  06/17/2023  8.  Pt will be able to perform work activities without adverse effects.  Baseline:  Goal status: INITIAL Target date: 06/17/2023      LONG TERM GOALS: Target date: 07/28/2023   Pt's L hip AROM will be [facility] for improved stiffness and daily mobility.  Baseline:  Goal status: INITIAL  2.   Pt will ambulate extended community distance without increased pain and significant difficulty.  Baseline:  Goal status: INITIAL  3.  Pt will be able to ascend and descend stairs with a reciprocal gait with good control.  Baseline: INITIAL   4.  Pt will be able to perform her normal standing activities and household chores without significant pain.   Baseline: INITIAL  5.  Pt will demo 4+/5 strength in L hip flex, abd, ext and knee ext/flex for improved performance of and tolerance with functional mobility  Baseline:  Goal status: INITIAL     PLAN:  PT FREQUENCY: 1x/wk for 3-4 weeks and 2x/wk afterwards.  PT DURATION: other: 16 weeks  PLANNED INTERVENTIONS: Therapeutic exercises, Therapeutic activity, Neuromuscular  re-education, Balance training, Gait training, Patient/Family education, Self Care, Joint mobilization, Stair training, DME instructions, Aquatic Therapy, Dry Needling, Electrical stimulation, Spinal mobilization, Cryotherapy, Moist heat, scar mobilization, Taping, Ultrasound, Manual therapy, and Re-evaluation  PLAN FOR NEXT SESSION: Cont per Dr. Serena Croissant hip labral repair protocol.    Skylan Gift C. Emmett Arntz PT, DPT 04/14/23 12:27 PM

## 2023-04-16 ENCOUNTER — Other Ambulatory Visit (HOSPITAL_COMMUNITY): Payer: Self-pay

## 2023-04-16 ENCOUNTER — Ambulatory Visit (INDEPENDENT_AMBULATORY_CARE_PROVIDER_SITE_OTHER): Payer: BC Managed Care – PPO | Admitting: Orthopaedic Surgery

## 2023-04-16 DIAGNOSIS — S73191A Other sprain of right hip, initial encounter: Secondary | ICD-10-CM

## 2023-04-16 NOTE — Progress Notes (Signed)
Post Operative Evaluation    Procedure/Date of Surgery: Status post right hip arthroscopy 6/4  Interval History:    Presents today for follow-up 2 weeks status post above procedure.  Overall she is doing remarkably well.  She is having some swelling in the right calf although this is improving dramatically without any palpable cords.  She is beginning to progress weightbearing   PMH/PSH/Family History/Social History/Meds/Allergies:    Past Medical History:  Diagnosis Date   Labral tear of hip joint    right   Past Surgical History:  Procedure Laterality Date   CHOLECYSTECTOMY     Social History   Socioeconomic History   Marital status: Married    Spouse name: Not on file   Number of children: 2   Years of education: Not on file   Highest education level: Not on file  Occupational History   Not on file  Tobacco Use   Smoking status: Never   Smokeless tobacco: Never  Vaping Use   Vaping Use: Never used  Substance and Sexual Activity   Alcohol use: Yes    Alcohol/week: 1.0 standard drink of alcohol    Types: 1 Glasses of wine per week    Comment: Socially   Drug use: Never   Sexual activity: Not on file  Other Topics Concern   Not on file  Social History Narrative   Not on file   Social Determinants of Health   Financial Resource Strain: Not on file  Food Insecurity: Not on file  Transportation Needs: Not on file  Physical Activity: Not on file  Stress: Not on file  Social Connections: Not on file   Family History  Problem Relation Age of Onset   Ovarian cancer Mother    Heart failure Father    No Known Allergies Current Outpatient Medications  Medication Sig Dispense Refill   aspirin EC 325 MG tablet Take 1 tablet (325 mg total) by mouth daily. 14 tablet 0   betamethasone dipropionate 0.05 % cream Apply 1 Application topically 2 (two) times daily.     buPROPion (WELLBUTRIN XL) 300 MG 24 hr tablet Take 1 tablet (300  mg total) by mouth daily. 90 tablet 1   clindamycin (CLEOCIN T) 1 % SWAB Apply 1 Application topically daily.     Glycerin-Hypromellose-PEG 400 (DRY EYE RELIEF DROPS OP) Place 1 drop into both eyes daily as needed (Dry eye).     Multiple Vitamins-Minerals (MULTIVITAMIN WITH MINERALS) tablet Take 1 tablet by mouth daily.     oxyCODONE (ROXICODONE) 5 MG immediate release tablet Take 1 tablet (5 mg total) by mouth every 4 (four) hours as needed for severe pain or breakthrough pain. (Patient not taking: Reported on 04/08/2023) 10 tablet 0   No current facility-administered medications for this visit.   No results found.  Review of Systems:   A ROS was performed including pertinent positives and negatives as documented in the HPI.   Musculoskeletal Exam:    There were no vitals taken for this visit.  Right hip with 30 degrees internal/external rotation without pain.  There is some hip abduction weakness.  Sensation is intact distally.  No palpable cords.  No swelling of the right calf.  Negative Homan  Imaging:      I personally reviewed and interpreted the radiographs.  Assessment:   2 weeks status post right hip arthroscopic labral repair overall doing extremely well.  At today's visit she may continue with her crutches she will continue to continue the brace over the course of the next 2 weeks.  I will plan to see her back in 4 weeks for reassessment  Plan :    -Return to clinic in 4 weeks for reassessment      I personally saw and evaluated the patient, and participated in the management and treatment plan.  Huel Cote, MD Attending Physician, Orthopedic Surgery  This document was dictated using Dragon voice recognition software. A reasonable attempt at proof reading has been made to minimize errors.

## 2023-04-21 ENCOUNTER — Encounter (HOSPITAL_BASED_OUTPATIENT_CLINIC_OR_DEPARTMENT_OTHER): Payer: Self-pay | Admitting: Physical Therapy

## 2023-04-21 ENCOUNTER — Ambulatory Visit (HOSPITAL_BASED_OUTPATIENT_CLINIC_OR_DEPARTMENT_OTHER): Payer: BC Managed Care – PPO | Admitting: Physical Therapy

## 2023-04-21 DIAGNOSIS — M25651 Stiffness of right hip, not elsewhere classified: Secondary | ICD-10-CM

## 2023-04-21 DIAGNOSIS — M25551 Pain in right hip: Secondary | ICD-10-CM | POA: Diagnosis not present

## 2023-04-21 DIAGNOSIS — M6281 Muscle weakness (generalized): Secondary | ICD-10-CM

## 2023-04-21 DIAGNOSIS — R262 Difficulty in walking, not elsewhere classified: Secondary | ICD-10-CM

## 2023-04-21 NOTE — Therapy (Signed)
OUTPATIENT PHYSICAL THERAPY LOWER EXTREMITY EVALUATION   Patient Name: Elizabeth Rose MRN: [id number] DOB:1981/07/13, 42 y.o., female Today's Date: 04/21/2023  END OF SESSION:  PT End of Session - 04/21/23 1145     Visit Number 3    Number of Visits 28    Date for PT Re-Evaluation 06/30/23    Authorization Type BCBS    PT Start Time 1145    PT Stop Time 1225    PT Time Calculation (min) 40 min    Activity Tolerance Patient tolerated treatment well;No increased pain    Behavior During Therapy [facility] for tasks assessed/performed              Past Medical History:  Diagnosis Date   Labral tear of hip joint    right   Past Surgical History:  Procedure Laterality Date   CHOLECYSTECTOMY     Patient Active Problem List   Diagnosis Date Noted   Tear of right acetabular labrum 04/01/2023   Unilateral primary osteoarthritis, right hip 01/30/2023   Obesity 11/08/2022   Symptomatic mammary hypertrophy 06/07/2022   Right hip pain 11/02/2021   Encounter for general adult medical examination with abnormal findings 11/02/2021   Elevated LFTs 06/03/2021   Family history of ovarian carcinoma 06/05/2018     REFERRING PROVIDER: Huel Cote, MD  REFERRING DIAG: M16.11 (ICD-10-CM) - Unilateral primary osteoarthritis, right hip  post op R hip labral repair   THERAPY DIAG:  Pain in right hip  Stiffness of right hip, not elsewhere classified  Muscle weakness (generalized)  Difficulty in walking, not elsewhere classified  Rationale for Evaluation and Treatment: Rehabilitation  ONSET DATE: 04/01/2023 DOS  SUBJECTIVE:   SUBJECTIVE STATEMENT: Some popping but no pain. Ambulating with single axiallary crutch because it feels funny without it.    PERTINENT HISTORY: R hip labral repair on 04/01/2023.  Pt is TDWB'ing x 2 weeks.  PAIN:  Are you having pain? Yes 2/10 current, 4/10 worst, 0/10 Location:  R hip  PRECAUTIONS: Other: per surgical protocol  WEIGHT BEARING  RESTRICTIONS: Yes TDWB'ing x 2 weeks  FALLS:  Has patient fallen in last 6 months? 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    PLOF: Independent.  Pt was able to perform her ADLs/IADLs and functional mobility skills independently.  Pt ambulated without an AD.  Pt was walking  2-4 miles 5x/wk.  Pt was also performing strength training.   PATIENT GOALS: return to her PLOF.  To be able to perform her walking program.   NEXT MD VISIT: 04/16/2023  OBJECTIVE:   DIAGNOSTIC FINDINGS: Pt is post op.  She did have an x ray and MRI prior to surgery.  Pt did have some cartilage loss per MRI.   PATIENT SURVEYS:  FOTO 18 with a goal of 60 at visit #21.  COGNITION: Overall cognitive status: Within functional limits for tasks assessed      OBSERVATION:  Pt reports her calf has been bothering her.  PT assessed R calf.  Pt had no signs of inflammation in calf including no redness.  She had generalized tenderness t/o calf and soft tissue tightness was present.   Pt had a negative homan's test.   PT changed her post operative dressings.  PT removed the tegaderm and gauze.  Pt had xeroform gauze  over incisions with stitches present.  Pt had expected post op drainage, nothing abnormal.  She had no signs of infection.  PT left xeroform gauze in place and applied new gauze and tegaderm over incisions.  PT educated pt concerning dressings.     LOWER EXTREMITY ROM:  Not tested today.  LOWER EXTREMITY MMT:  Strength not tested due to healing constraints and post op protocol.    GAIT: Assistive device utilized: Crutches Level of assistance:  Independent Comments:  Pt ambulated with bilat crutches with a step to gait pattern adhering to TDWB'ing restrictions.   TODAY'S TREATMENT:                                                                                                                                 Treatment                            6/24:  Pectineus STM Clam Sidleying 90/90 abd, IR Lt foot to tap heels Bridge with heels together Standing weight shift- lateral & wide tandem PT assist to avoid FA join IR in mid stance phase Standing hip circles on slider, red tband around knees Gait: glut activation+hip extension    Treatment                            6/17:  Recumb bike 5 min Prone position, added glut sets, added alt HS curls with ab set Sidelying clams Standing weight shift Gait training with crutches- heel toe, glut set, upright posture   PATIENT EDUCATION:  Education details: Anatomy of condition, POC, HEP, exercise form/rationale Person educated: Patient  Education method: Explanation, Demonstration, Tactile cues, Verbal cues, and Handouts Education comprehension: verbalized understanding, returned demonstration, verbal cues required, tactile cues required  HOME EXERCISE PROGRAM: Access Code: [email or web address] URL: [email or web address]   ASSESSMENT:  CLINICAL IMPRESSION: Good strength improvements, lacking awareness of activation in gait. Improved significantly with cuing today. Will continue to use single crutch if she is limping without it but will otherwise work on training.   OBJECTIVE IMPAIRMENTS: Abnormal gait, decreased activity tolerance, decreased mobility, difficulty walking, decreased ROM, decreased strength, hypomobility, impaired flexibility, and pain.   ACTIVITY LIMITATIONS: lifting, bending, standing, squatting, stairs, transfers, dressing, and locomotion level  PARTICIPATION LIMITATIONS: meal prep, cleaning, laundry, driving, shopping, community activity, and occupation  PERSONAL FACTORS:    REHAB POTENTIAL: Good  CLINICAL DECISION MAKING: Stable/uncomplicated  EVALUATION COMPLEXITY: Low   GOALS:   SHORT TERM GOALS:  Pt  will be independent and compliant with HEP for improved pain, strength, ROM, and function.  Baseline: Goal status: INITIAL Target date:  05/05/2023   2.  Pt will progress with hip PROM per protocol for improved stiffness and mobility.   Baseline:  Goal status: achieved Target date:  05/12/2023  3.   Pt will wean off of crutches and out  of brace without adverse effects.  Baseline:  Goal status: INITIAL Target date:  05/13/2023  4.  Pt will progress with exercises per protocol without adverse effects for improved strength and mobility.  Baseline:  Goal status: INITIAL Target date: 05/26/2023   5. Pt will perform a 6 inch step up with good form and good control without UE support.   Baseline:  Goal status: INITIAL Target date:  05/27/2023   6.  Pt will have no pain and demo good form with squatting for improved function strength and to assist with functional mobility.  Baseline:  Goal status: INITIAL Target date: 06/24/2023  7.  Pt will ambulate with a normalized heel to toe gait pattern without limping.  Baseline:  Goal status: INITIAL Target date:  06/17/2023  8.  Pt will be able to perform work activities without adverse effects.  Baseline:  Goal status: INITIAL Target date: 06/17/2023      LONG TERM GOALS: Target date: 07/28/2023   Pt's L hip AROM will be [facility] for improved stiffness and daily mobility.  Baseline:  Goal status: INITIAL  2.   Pt will ambulate extended community distance without increased pain and significant difficulty.  Baseline:  Goal status: INITIAL  3.  Pt will be able to ascend and descend stairs with a reciprocal gait with good control.  Baseline: INITIAL   4.  Pt will be able to perform her normal standing activities and household chores without significant pain.   Baseline: INITIAL  5.  Pt will demo 4+/5 strength in L hip flex, abd, ext and knee ext/flex for improved performance of and tolerance with functional mobility  Baseline:  Goal  status: INITIAL     PLAN:  PT FREQUENCY: 1x/wk for 3-4 weeks and 2x/wk afterwards.  PT DURATION: other: 16 weeks  PLANNED INTERVENTIONS: Therapeutic exercises, Therapeutic activity, Neuromuscular re-education, Balance training, Gait training, Patient/Family education, Self Care, Joint mobilization, Stair training, DME instructions, Aquatic Therapy, Dry Needling, Electrical stimulation, Spinal mobilization, Cryotherapy, Moist heat, scar mobilization, Taping, Ultrasound, Manual therapy, and Re-evaluation  PLAN FOR NEXT SESSION: Cont per Dr. Serena Croissant hip labral repair protocol.    Reace Breshears C. Swannie Milius PT, DPT 04/21/23 12:27 PM

## 2023-04-22 ENCOUNTER — Encounter (HOSPITAL_BASED_OUTPATIENT_CLINIC_OR_DEPARTMENT_OTHER): Payer: Self-pay | Admitting: Orthopaedic Surgery

## 2023-04-23 ENCOUNTER — Encounter (HOSPITAL_BASED_OUTPATIENT_CLINIC_OR_DEPARTMENT_OTHER): Payer: Self-pay | Admitting: Orthopaedic Surgery

## 2023-04-28 ENCOUNTER — Encounter (HOSPITAL_BASED_OUTPATIENT_CLINIC_OR_DEPARTMENT_OTHER): Payer: Self-pay

## 2023-04-28 ENCOUNTER — Ambulatory Visit (HOSPITAL_BASED_OUTPATIENT_CLINIC_OR_DEPARTMENT_OTHER): Payer: BC Managed Care – PPO | Attending: Orthopaedic Surgery

## 2023-04-28 DIAGNOSIS — M25551 Pain in right hip: Secondary | ICD-10-CM | POA: Insufficient documentation

## 2023-04-28 DIAGNOSIS — R262 Difficulty in walking, not elsewhere classified: Secondary | ICD-10-CM | POA: Insufficient documentation

## 2023-04-28 DIAGNOSIS — M25651 Stiffness of right hip, not elsewhere classified: Secondary | ICD-10-CM | POA: Diagnosis present

## 2023-04-28 DIAGNOSIS — M6281 Muscle weakness (generalized): Secondary | ICD-10-CM | POA: Diagnosis present

## 2023-04-28 NOTE — Therapy (Signed)
OUTPATIENT PHYSICAL THERAPY LOWER EXTREMITY TREATMENT   Patient Name: Elizabeth Rose MRN: [id number] DOB:[birth year], 42 y.o., female Today's Date: 04/28/2023  END OF SESSION:  PT End of Session - 04/28/23 1005     Visit Number 4    Number of Visits 28    Date for PT Re-Evaluation 06/30/23    Authorization Type BCBS    Authorization Time Period 02/14/23 to 03/28/23    Authorization - Number of Visits 20    PT Start Time 0933    PT Stop Time 1015    PT Time Calculation (min) 42 min    Activity Tolerance Patient tolerated treatment well    Behavior During Therapy [facility] for tasks assessed/performed               Past Medical History:  Diagnosis Date   Labral tear of hip joint    right   Past Surgical History:  Procedure Laterality Date   CHOLECYSTECTOMY     Patient Active Problem List   Diagnosis Date Noted   Tear of right acetabular labrum 04/01/2023   Unilateral primary osteoarthritis, right hip 01/30/2023   Obesity 11/08/2022   Symptomatic mammary hypertrophy 06/07/2022   Right hip pain 11/02/2021   Encounter for general adult medical examination with abnormal findings 11/02/2021   Elevated LFTs 06/03/2021   Family history of ovarian carcinoma 06/05/2018     REFERRING PROVIDER: Huel Cote, MD  REFERRING DIAG: M16.11 (ICD-10-CM) - Unilateral primary osteoarthritis, right hip  post op R hip labral repair   THERAPY DIAG:  Pain in right hip  Stiffness of right hip, not elsewhere classified  Muscle weakness (generalized)  Difficulty in walking, not elsewhere classified  Rationale for Evaluation and Treatment: Rehabilitation  ONSET DATE: 04/01/2023 DOS  SUBJECTIVE:   SUBJECTIVE STATEMENT: Pt reports some pinching in anterior hip on occasion. She reports compliance with HEP and stationary bike. Does 2x/day    PERTINENT HISTORY: R hip labral repair on 04/01/2023.  Pt is TDWB'ing x 2 weeks.  PAIN:  Are you having pain? Yes 2/10 current, 4/10  worst, 0/10 Location:  R hip  PRECAUTIONS: Other: per surgical protocol  WEIGHT BEARING RESTRICTIONS: Yes TDWB'ing x 2 weeks  FALLS:  Has patient fallen in last 6 months? 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    PLOF: Independent.  Pt was able to perform her ADLs/IADLs and functional mobility skills independently.  Pt ambulated without an AD.  Pt was walking  2-4 miles 5x/wk.  Pt was also performing strength training.   PATIENT GOALS: return to her PLOF.  To be able to perform her walking program.   NEXT MD VISIT: 04/16/2023  OBJECTIVE:   DIAGNOSTIC FINDINGS: Pt is post op.  She did have an x ray and MRI prior to surgery.  Pt did have some cartilage loss per MRI.   PATIENT SURVEYS:  FOTO 18 with a goal of 60 at visit #21.  COGNITION: Overall cognitive status: Within functional limits for tasks assessed      OBSERVATION:   LOWER EXTREMITY ROM:  Not tested today.  LOWER EXTREMITY MMT:  Strength not tested due to healing constraints and post op protocol.    GAIT: Assistive device utilized: Crutches Level of assistance:  Independent Comments:  Pt ambulated with bilat crutches with  a step to gait pattern adhering to TDWB'ing restrictions.   TODAY'S TREATMENT:                                                                                                                                Treatment                            7/1:  -PROM R hip -STM anterior hip2 -bridges 2x10 -Sidelying clams 2x10 -Sidelying reverse clams 2x10 -prone hip extension 2x10 -Q-ped weight shifting 2x10 -short knee to tall kneel 2x10 -LAQ 4#- 5" hold x20 (increase next time) -Partial squats x10 -Gait in hall with/withut crutch x1/2 hall each   Treatment                             6/24:  Pectineus STM Clam Sidleying 90/90 abd, IR Lt foot to tap heels Bridge with heels together Standing weight shift- lateral & wide tandem PT assist to avoid FA join IR in mid stance phase Standing hip circles on slider, red tband around knees Gait: glut activation+hip extension    Treatment                            6/17:  Recumb bike 5 min Prone position, added glut sets, added alt HS curls with ab set Sidelying clams Standing weight shift Gait training with crutches- heel toe, glut set, upright posture   PATIENT EDUCATION:  Education details: Anatomy of condition, POC, HEP, exercise form/rationale Person educated: Patient  Education method: Explanation, Demonstration, Tactile cues, Verbal cues, and Handouts Education comprehension: verbalized understanding, returned demonstration, verbal cues required, tactile cues required  HOME EXERCISE PROGRAM: Access Code: [email or web address] URL: [email or web address]   ASSESSMENT:  CLINICAL IMPRESSION: Good tolerance for hip PROM all planes without significant restriction or deficit. She denied pain with exercises today, tough does have some anterior hip "tightness". Educated pt on precautions and protocol progressions. Instructed pt to remain within pain tolerance with exercises. Demonstrates minimal deviation for short distance gait training in clinic without AD. Will discuss with evaluating PT containing with 1x/week with regular HEP updates per pt request.   OBJECTIVE IMPAIRMENTS: Abnormal gait, decreased activity tolerance, decreased mobility, difficulty walking, decreased ROM, decreased strength, hypomobility, impaired flexibility, and pain.   ACTIVITY LIMITATIONS: lifting, bending, standing, squatting, stairs, transfers, dressing, and locomotion level  PARTICIPATION LIMITATIONS: meal prep, cleaning, laundry, driving, shopping, community activity, and occupation  PERSONAL FACTORS:    REHAB POTENTIAL:  Good  CLINICAL DECISION MAKING: Stable/uncomplicated  EVALUATION COMPLEXITY: Low   GOALS:   SHORT TERM GOALS:  Pt will be independent and compliant with HEP for improved pain, strength, ROM, and function.  Baseline: Goal status: MET 7/1 Target date:  05/05/2023   2.  Pt will progress with hip PROM per protocol for  improved stiffness and mobility.   Baseline:  Goal status: achieved Target date:  05/12/2023  3.   Pt will wean off of crutches and out of brace without adverse effects.  Baseline:  Goal status: IN PROGRESS 7/1 Target date:  05/13/2023  4.  Pt will progress with exercises per protocol without adverse effects for improved strength and mobility.  Baseline:  Goal status: INITIAL Target date: 05/26/2023   5. Pt will perform a 6 inch step up with good form and good control without UE support.   Baseline:  Goal status: INITIAL Target date:  05/27/2023   6.  Pt will have no pain and demo good form with squatting for improved function strength and to assist with functional mobility.  Baseline:  Goal status: INITIAL Target date: 06/24/2023  7.  Pt will ambulate with a normalized heel to toe gait pattern without limping.  Baseline:  Goal status: INITIAL Target date:  06/17/2023  8.  Pt will be able to perform work activities without adverse effects.  Baseline:  Goal status: INITIAL Target date: 06/17/2023      LONG TERM GOALS: Target date: 07/28/2023   Pt's L hip AROM will be [facility] for improved stiffness and daily mobility.  Baseline:  Goal status: INITIAL  2.   Pt will ambulate extended community distance without increased pain and significant difficulty.  Baseline:  Goal status: INITIAL  3.  Pt will be able to ascend and descend stairs with a reciprocal gait with good control.  Baseline: INITIAL   4.  Pt will be able to perform her normal standing activities and household chores without significant pain.   Baseline: INITIAL  5.  Pt will demo 4+/5  strength in L hip flex, abd, ext and knee ext/flex for improved performance of and tolerance with functional mobility  Baseline:  Goal status: INITIAL     PLAN:  PT FREQUENCY: 1x/wk for 3-4 weeks and 2x/wk afterwards.  PT DURATION: other: 16 weeks  PLANNED INTERVENTIONS: Therapeutic exercises, Therapeutic activity, Neuromuscular re-education, Balance training, Gait training, Patient/Family education, Self Care, Joint mobilization, Stair training, DME instructions, Aquatic Therapy, Dry Needling, Electrical stimulation, Spinal mobilization, Cryotherapy, Moist heat, scar mobilization, Taping, Ultrasound, Manual therapy, and Re-evaluation  PLAN FOR NEXT SESSION: Cont per Dr. Serena Croissant hip labral repair protocol.    Riki Altes, PTA  04/28/23 11:15 AM

## 2023-05-02 ENCOUNTER — Encounter (HOSPITAL_BASED_OUTPATIENT_CLINIC_OR_DEPARTMENT_OTHER): Payer: Self-pay

## 2023-05-02 ENCOUNTER — Ambulatory Visit (HOSPITAL_BASED_OUTPATIENT_CLINIC_OR_DEPARTMENT_OTHER): Payer: BC Managed Care – PPO

## 2023-05-02 DIAGNOSIS — M25551 Pain in right hip: Secondary | ICD-10-CM | POA: Diagnosis not present

## 2023-05-02 DIAGNOSIS — M25651 Stiffness of right hip, not elsewhere classified: Secondary | ICD-10-CM

## 2023-05-02 DIAGNOSIS — R262 Difficulty in walking, not elsewhere classified: Secondary | ICD-10-CM

## 2023-05-02 DIAGNOSIS — M6281 Muscle weakness (generalized): Secondary | ICD-10-CM

## 2023-05-02 NOTE — Therapy (Signed)
OUTPATIENT PHYSICAL THERAPY LOWER EXTREMITY TREATMENT   Patient Name: Elizabeth Rose MRN: [id number] DOB:[birth year], 42 y.o., female Today's Date: 05/02/2023  END OF SESSION:  PT End of Session - 05/02/23 0908     Visit Number 5    Number of Visits 28    Date for PT Re-Evaluation 06/30/23    Authorization Type BCBS    Authorization Time Period 02/14/23 to 03/28/23    Authorization - Number of Visits 20    PT Start Time 0848    PT Stop Time 0929    PT Time Calculation (min) 41 min    Activity Tolerance Patient tolerated treatment well    Behavior During Therapy [facility] for tasks assessed/performed                Past Medical History:  Diagnosis Date   Labral tear of hip joint    right   Past Surgical History:  Procedure Laterality Date   CHOLECYSTECTOMY     Patient Active Problem List   Diagnosis Date Noted   Tear of right acetabular labrum 04/01/2023   Unilateral primary osteoarthritis, right hip 01/30/2023   Obesity 11/08/2022   Symptomatic mammary hypertrophy 06/07/2022   Right hip pain 11/02/2021   Encounter for general adult medical examination with abnormal findings 11/02/2021   Elevated LFTs 06/03/2021   Family history of ovarian carcinoma 06/05/2018     REFERRING PROVIDER: Huel Cote, MD  REFERRING DIAG: M16.11 (ICD-10-CM) - Unilateral primary osteoarthritis, right hip  post op R hip labral repair   THERAPY DIAG:  Pain in right hip  Stiffness of right hip, not elsewhere classified  Muscle weakness (generalized)  Difficulty in walking, not elsewhere classified  Rationale for Evaluation and Treatment: Rehabilitation  ONSET DATE: 04/01/2023 DOS  SUBJECTIVE:   SUBJECTIVE STATEMENT: Pt reports some fatigue after last visit, but no pain. No pain at entry, only mild achiness/tightness. Started walking without crutch 1-2 days ago, but will use for longer distances.    PERTINENT HISTORY: R hip labral repair on 04/01/2023.  Pt is TDWB'ing x 2  weeks.  PAIN:  Are you having pain? Yes 2/10 current, 4/10 worst, 0/10 Location:  R hip  PRECAUTIONS: Other: per surgical protocol  WEIGHT BEARING RESTRICTIONS: Yes TDWB'ing x 2 weeks  FALLS:  Has patient fallen in last 6 months? 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    PLOF: Independent.  Pt was able to perform her ADLs/IADLs and functional mobility skills independently.  Pt ambulated without an AD.  Pt was walking  2-4 miles 5x/wk.  Pt was also performing strength training.   PATIENT GOALS: return to her PLOF.  To be able to perform her walking program.   NEXT MD VISIT: 04/16/2023  OBJECTIVE:   DIAGNOSTIC FINDINGS: Pt is post op.  She did have an x ray and MRI prior to surgery.  Pt did have some cartilage loss per MRI.   PATIENT SURVEYS:  FOTO 18 with a goal of 60 at visit #21.  COGNITION: Overall cognitive status: Within functional limits for tasks assessed      OBSERVATION:   LOWER EXTREMITY ROM:  Not tested today.  LOWER EXTREMITY MMT:  Strength not tested due to healing constraints and post op protocol.    GAIT: Assistive device utilized: Crutches Level of  assistance:  Independent Comments:  Pt ambulated with bilat crutches with a step to gait pattern adhering to TDWB'ing restrictions.   TODAY'S TREATMENT:                                                                                                                                Treatment                            7/5:  -PROM R hip -STM anterior hip -bridges 1x10 , staggered 1x10 -Sidelying clams rtb 3x10 -Sidelying reverse clams 3x10 -prone hip extension 2# 3x10 -prone HSC 5# 3x10  -LAQ 5#- 5" hold 3x10 -Partial squats 2x10 -Gait -360ft no crutch -retro walking- 1/2 hallx2 -lateral  walking-full hall x2   Treatment                            7/1:  -PROM R hip -STM anterior hip2 -bridges 2x10 -Sidelying clams 2x10 -Sidelying reverse clams 2x10 -prone hip extension 2x10 -Q-ped weight shifting 2x10 -short knee to tall kneel 2x10 -LAQ 4#- 5" hold x20 (increase next time) -Partial squats x10 -Gait in hall with/withut crutch x1/2 hall each   Treatment                            6/24:  Pectineus STM Clam Sidleying 90/90 abd, IR Lt foot to tap heels Bridge with heels together Standing weight shift- lateral & wide tandem PT assist to avoid FA join IR in mid stance phase Standing hip circles on slider, red tband around knees Gait: glut activation+hip extension    Treatment                            6/17:  Recumb bike 5 min Prone position, added glut sets, added alt HS curls with ab set Sidelying clams Standing weight shift Gait training with crutches- heel toe, glut set, upright posture   PATIENT EDUCATION:  Education details: Anatomy of condition, POC, HEP, exercise form/rationale Person educated: Patient  Education method: Explanation, Demonstration, Tactile cues, Verbal cues, and Handouts Education comprehension: verbalized understanding, returned demonstration, verbal cues required, tactile cues required  HOME EXERCISE PROGRAM: Access Code: [email or web address] URL: [email or web address]   ASSESSMENT:  CLINICAL IMPRESSION: No restrictions or discomfort with PROM. Added resistance to open chain strengthening today with good tolerance. Improved gait quality with very minimal deviation. Reviewed restrictions with pt with good understanding. No complaints throughout session.   OBJECTIVE IMPAIRMENTS: Abnormal gait, decreased activity tolerance, decreased mobility, difficulty walking, decreased ROM, decreased strength, hypomobility, impaired flexibility, and pain.   ACTIVITY LIMITATIONS: lifting, bending, standing, squatting, stairs, transfers,  dressing, and locomotion level  PARTICIPATION LIMITATIONS: meal prep, cleaning, laundry, driving, shopping, community activity, and occupation  PERSONAL FACTORS:    REHAB  POTENTIAL: Good  CLINICAL DECISION MAKING: Stable/uncomplicated  EVALUATION COMPLEXITY: Low   GOALS:   SHORT TERM GOALS:  Pt will be independent and compliant with HEP for improved pain, strength, ROM, and function.  Baseline: Goal status: MET 7/1 Target date:  05/05/2023   2.  Pt will progress with hip PROM per protocol for improved stiffness and mobility.   Baseline:  Goal status: achieved Target date:  05/12/2023  3.   Pt will wean off of crutches and out of brace without adverse effects.  Baseline:  Goal status: IN PROGRESS 7/1 Target date:  05/13/2023  4.  Pt will progress with exercises per protocol without adverse effects for improved strength and mobility.  Baseline:  Goal status: INITIAL Target date: 05/26/2023   5. Pt will perform a 6 inch step up with good form and good control without UE support.   Baseline:  Goal status: INITIAL Target date:  05/27/2023   6.  Pt will have no pain and demo good form with squatting for improved function strength and to assist with functional mobility.  Baseline:  Goal status: INITIAL Target date: 06/24/2023  7.  Pt will ambulate with a normalized heel to toe gait pattern without limping.  Baseline:  Goal status: INITIAL Target date:  06/17/2023  8.  Pt will be able to perform work activities without adverse effects.  Baseline:  Goal status: INITIAL Target date: 06/17/2023      LONG TERM GOALS: Target date: 07/28/2023   Pt's L hip AROM will be [facility] for improved stiffness and daily mobility.  Baseline:  Goal status: INITIAL  2.   Pt will ambulate extended community distance without increased pain and significant difficulty.  Baseline:  Goal status: INITIAL  3.  Pt will be able to ascend and descend stairs with a reciprocal gait with good  control.  Baseline: INITIAL   4.  Pt will be able to perform her normal standing activities and household chores without significant pain.   Baseline: INITIAL  5.  Pt will demo 4+/5 strength in L hip flex, abd, ext and knee ext/flex for improved performance of and tolerance with functional mobility  Baseline:  Goal status: INITIAL     PLAN:  PT FREQUENCY: 1x/wk for 3-4 weeks and 2x/wk afterwards.  PT DURATION: other: 16 weeks  PLANNED INTERVENTIONS: Therapeutic exercises, Therapeutic activity, Neuromuscular re-education, Balance training, Gait training, Patient/Family education, Self Care, Joint mobilization, Stair training, DME instructions, Aquatic Therapy, Dry Needling, Electrical stimulation, Spinal mobilization, Cryotherapy, Moist heat, scar mobilization, Taping, Ultrasound, Manual therapy, and Re-evaluation  PLAN FOR NEXT SESSION: Cont per Dr. Serena Croissant hip labral repair protocol.    Riki Altes, PTA  05/02/23 10:13 AM

## 2023-05-05 ENCOUNTER — Encounter (HOSPITAL_BASED_OUTPATIENT_CLINIC_OR_DEPARTMENT_OTHER): Payer: BC Managed Care – PPO | Admitting: Physical Therapy

## 2023-05-08 ENCOUNTER — Encounter (HOSPITAL_BASED_OUTPATIENT_CLINIC_OR_DEPARTMENT_OTHER): Payer: BC Managed Care – PPO

## 2023-05-09 ENCOUNTER — Ambulatory Visit (HOSPITAL_BASED_OUTPATIENT_CLINIC_OR_DEPARTMENT_OTHER): Payer: BC Managed Care – PPO

## 2023-05-09 ENCOUNTER — Encounter (HOSPITAL_BASED_OUTPATIENT_CLINIC_OR_DEPARTMENT_OTHER): Payer: Self-pay

## 2023-05-09 DIAGNOSIS — M25651 Stiffness of right hip, not elsewhere classified: Secondary | ICD-10-CM

## 2023-05-09 DIAGNOSIS — M25551 Pain in right hip: Secondary | ICD-10-CM

## 2023-05-09 DIAGNOSIS — M6281 Muscle weakness (generalized): Secondary | ICD-10-CM

## 2023-05-09 DIAGNOSIS — R262 Difficulty in walking, not elsewhere classified: Secondary | ICD-10-CM

## 2023-05-09 NOTE — Therapy (Signed)
OUTPATIENT PHYSICAL THERAPY LOWER EXTREMITY TREATMENT   Patient Name: Elizabeth Rose MRN: [id number] DOB:1981/01/16, 42 y.o., female Today's Date: 05/09/2023  END OF SESSION:  PT End of Session - 05/09/23 1103     Visit Number 6    Number of Visits 28    Date for PT Re-Evaluation 06/30/23    Authorization Type [employer]    Authorization Time Period 02/14/23 to 03/28/23    Authorization - Number of Visits 20    PT Start Time 1105    PT Stop Time 1146    PT Time Calculation (min) 41 min    Activity Tolerance Patient tolerated treatment well    Behavior During Therapy [facility] for tasks assessed/performed                Past Medical History:  Diagnosis Date   Labral tear of hip joint    right   Past Surgical History:  Procedure Laterality Date   CHOLECYSTECTOMY     Patient Active Problem List   Diagnosis Date Noted   Tear of right acetabular labrum 04/01/2023   Unilateral primary osteoarthritis, right hip 01/30/2023   Obesity 11/08/2022   Symptomatic mammary hypertrophy 06/07/2022   Right hip pain 11/02/2021   Encounter for general adult medical examination with abnormal findings 11/02/2021   Elevated LFTs 06/03/2021   Family history of ovarian carcinoma 06/05/2018     REFERRING PROVIDER: Huel Cote, MD  REFERRING DIAG: M16.11 (ICD-10-CM) - Unilateral primary osteoarthritis, right hip  post op R hip labral repair   THERAPY DIAG:  Pain in right hip  Stiffness of right hip, not elsewhere classified  Difficulty in walking, not elsewhere classified  Muscle weakness (generalized)  Rationale for Evaluation and Treatment: Rehabilitation  ONSET DATE: 04/01/2023 DOS  SUBJECTIVE:   SUBJECTIVE STATEMENT: Pt reports no pain at enty.    PERTINENT HISTORY: R hip labral repair on 04/01/2023.  Pt is TDWB'ing x 2 weeks.  PAIN:  Are you having pain? Yes 2/10 current, 4/10 worst, 0/10 Location:  R hip  PRECAUTIONS: Other: per surgical protocol  WEIGHT BEARING  RESTRICTIONS: Yes TDWB'ing x 2 weeks  FALLS:  Has patient fallen in last 6 months? 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    PLOF: Independent.  Pt was able to perform her ADLs/IADLs and functional mobility skills independently.  Pt ambulated without an AD.  Pt was walking  2-4 miles 5x/wk.  Pt was also performing strength training.   PATIENT GOALS: return to her PLOF.  To be able to perform her walking program.   NEXT MD VISIT: 04/16/2023  OBJECTIVE:   DIAGNOSTIC FINDINGS: Pt is post op.  She did have an x ray and MRI prior to surgery.  Pt did have some cartilage loss per MRI.   PATIENT SURVEYS:  FOTO 18 with a goal of 60 at visit #21. 7/12: FOTO:55%  COGNITION: Overall cognitive status: Within functional limits for tasks assessed      OBSERVATION:   LOWER EXTREMITY ROM:  Not tested today.  LOWER EXTREMITY MMT:  Strength not tested due to healing constraints and post op protocol.    GAIT: Assistive device utilized: Crutches Level of assistance:  Independent Comments:  Pt ambulated with bilat crutches with a step to gait pattern adhering to TDWB'ing restrictions.  TODAY'S TREATMENT:                                                                                                                                  Treatment                            7/12:  FOTO: 55%  -PROM R hip -Joint mobilizations (distraction grade II and posterior glides grade II-III) Hooklying butterfly stretch 20sx3 -bridges 1x10 , staggered 1x10 -Sidelying clams x20 -Sidelying reverse clams x20 -prone hip extension 3# 3x10 with knee flexed   -LAQ 5#- 5" hold 3x10 -Partial squats 2x10 -Gait -33ft no crutch -retro walking- 1/2 hallx2 -lateral walking-full hall x2   Treatment                             7/5:  -PROM R hip -STM anterior hip -bridges 1x10 , staggered 1x10 -Sidelying clams rtb 3x10 -Sidelying reverse clams 3x10 -prone hip extension 2# 3x10 -prone HSC 5# 3x10  -LAQ 5#- 5" hold 3x10 -Partial squats 2x10 -Gait -337ft no crutch -retro walking- 1/2 hallx2 -lateral walking-full hall x2   Treatment                            7/1:  -PROM R hip -STM anterior hip2 -bridges 2x10 -Sidelying clams 2x10 -Sidelying reverse clams 2x10 -prone hip extension 2x10 -Q-ped weight shifting 2x10 -short knee to tall kneel 2x10 -LAQ 4#- 5" hold x20 (increase next time) -Partial squats x10 -Gait in hall with/withut crutch x1/2 hall each   Treatment                            6/24:  Pectineus STM Clam Sidleying 90/90 abd, IR Lt foot to tap heels Bridge with heels together Standing weight shift- lateral & wide tandem PT assist to avoid FA join IR in mid stance phase Standing hip circles on slider, red tband around knees Gait: glut activation+hip extension    Treatment                            6/17:  Recumb bike 5 min Prone position, added glut sets, added alt HS curls with ab set Sidelying clams Standing weight shift Gait training with crutches- heel toe, glut set, upright posture   PATIENT EDUCATION:  Education details: Anatomy of condition, POC, HEP, exercise form/rationale Person educated: Patient  Education method: Explanation, Demonstration, Tactile cues, Verbal cues, and Handouts Education comprehension: verbalized understanding, returned demonstration, verbal cues required, tactile cues required  HOME EXERCISE PROGRAM: Access Code: [email or web address] URL: [email or web address]   ASSESSMENT:  CLINICAL IMPRESSION: FOTO score increased to 55%. Trialed joint mobilizations per protocol today to see if this helps pt's  complaints of "pinching" in anterior hip. Will monitor and continue if helpful. She has excellent tolerance for strengthening  exercises with minimal to no discomfort with any exercises. Reviewed squat technique with pt and advised her to stay in partial range. Pt will be 6 weeks s/p next week. Can trial step ups at that time.  OBJECTIVE IMPAIRMENTS: Abnormal gait, decreased activity tolerance, decreased mobility, difficulty walking, decreased ROM, decreased strength, hypomobility, impaired flexibility, and pain.   ACTIVITY LIMITATIONS: lifting, bending, standing, squatting, stairs, transfers, dressing, and locomotion level  PARTICIPATION LIMITATIONS: meal prep, cleaning, laundry, driving, shopping, community activity, and occupation  PERSONAL FACTORS:    REHAB POTENTIAL: Good  CLINICAL DECISION MAKING: Stable/uncomplicated  EVALUATION COMPLEXITY: Low   GOALS:   SHORT TERM GOALS:  Pt will be independent and compliant with HEP for improved pain, strength, ROM, and function.  Baseline: Goal status: MET 7/1 Target date:  05/05/2023   2.  Pt will progress with hip PROM per protocol for improved stiffness and mobility.   Baseline:  Goal status: achieved Target date:  05/12/2023  3.   Pt will wean off of crutches and out of brace without adverse effects.  Baseline:  Goal status: IN PROGRESS 7/1 Target date:  05/13/2023  4.  Pt will progress with exercises per protocol without adverse effects for improved strength and mobility.  Baseline:  Goal status: INITIAL Target date: 05/26/2023   5. Pt will perform a 6 inch step up with good form and good control without UE support.   Baseline:  Goal status: INITIAL Target date:  05/27/2023   6.  Pt will have no pain and demo good form with squatting for improved function strength and to assist with functional mobility.  Baseline:  Goal status: INITIAL Target date: 06/24/2023  7.  Pt will ambulate with a normalized heel to toe gait pattern without limping.  Baseline:  Goal status: INITIAL Target date:  06/17/2023  8.  Pt will be able to perform work  activities without adverse effects.  Baseline:  Goal status: INITIAL Target date: 06/17/2023      LONG TERM GOALS: Target date: 07/28/2023   Pt's L hip AROM will be [facility] for improved stiffness and daily mobility.  Baseline:  Goal status: INITIAL  2.   Pt will ambulate extended community distance without increased pain and significant difficulty.  Baseline:  Goal status: INITIAL  3.  Pt will be able to ascend and descend stairs with a reciprocal gait with good control.  Baseline: INITIAL   4.  Pt will be able to perform her normal standing activities and household chores without significant pain.   Baseline: INITIAL  5.  Pt will demo 4+/5 strength in L hip flex, abd, ext and knee ext/flex for improved performance of and tolerance with functional mobility  Baseline:  Goal status: INITIAL     PLAN:  PT FREQUENCY: 1x/wk for 3-4 weeks and 2x/wk afterwards.  PT DURATION: other: 16 weeks  PLANNED INTERVENTIONS: Therapeutic exercises, Therapeutic activity, Neuromuscular re-education, Balance training, Gait training, Patient/Family education, Self Care, Joint mobilization, Stair training, DME instructions, Aquatic Therapy, Dry Needling, Electrical stimulation, Spinal mobilization, Cryotherapy, Moist heat, scar mobilization, Taping, Ultrasound, Manual therapy, and Re-evaluation  PLAN FOR NEXT SESSION: Cont per Dr. Serena Croissant hip labral repair protocol.    Riki Altes, PTA  05/09/23 12:15 PM

## 2023-05-12 ENCOUNTER — Encounter (HOSPITAL_BASED_OUTPATIENT_CLINIC_OR_DEPARTMENT_OTHER): Payer: BC Managed Care – PPO | Admitting: Physical Therapy

## 2023-05-15 ENCOUNTER — Encounter (HOSPITAL_BASED_OUTPATIENT_CLINIC_OR_DEPARTMENT_OTHER): Payer: BC Managed Care – PPO

## 2023-05-16 ENCOUNTER — Ambulatory Visit (HOSPITAL_BASED_OUTPATIENT_CLINIC_OR_DEPARTMENT_OTHER): Payer: BC Managed Care – PPO | Admitting: Physical Therapy

## 2023-05-16 ENCOUNTER — Ambulatory Visit (INDEPENDENT_AMBULATORY_CARE_PROVIDER_SITE_OTHER): Payer: BC Managed Care – PPO | Admitting: Orthopaedic Surgery

## 2023-05-16 DIAGNOSIS — S73191A Other sprain of right hip, initial encounter: Secondary | ICD-10-CM

## 2023-05-16 DIAGNOSIS — R262 Difficulty in walking, not elsewhere classified: Secondary | ICD-10-CM

## 2023-05-16 DIAGNOSIS — M25651 Stiffness of right hip, not elsewhere classified: Secondary | ICD-10-CM

## 2023-05-16 DIAGNOSIS — M6281 Muscle weakness (generalized): Secondary | ICD-10-CM

## 2023-05-16 DIAGNOSIS — M25551 Pain in right hip: Secondary | ICD-10-CM

## 2023-05-16 NOTE — Therapy (Signed)
OUTPATIENT PHYSICAL THERAPY LOWER EXTREMITY TREATMENT   Patient Name: Elizabeth Rose MRN: [id number] DOB:08/06/1981, 42 y.o., female Today's Date: 05/17/2023  END OF SESSION:  PT End of Session - 05/16/23 1121     Visit Number 7    Number of Visits 28    Date for PT Re-Evaluation 06/30/23    Authorization Type BCBS    PT Start Time 1030    PT Stop Time 1110    PT Time Calculation (min) 40 min    Activity Tolerance Patient tolerated treatment well    Behavior During Therapy [facility] for tasks assessed/performed                 Past Medical History:  Diagnosis Date   Labral tear of hip joint    right   Past Surgical History:  Procedure Laterality Date   CHOLECYSTECTOMY     Patient Active Problem List   Diagnosis Date Noted   Tear of right acetabular labrum 04/01/2023   Unilateral primary osteoarthritis, right hip 01/30/2023   Obesity 11/08/2022   Symptomatic mammary hypertrophy 06/07/2022   Right hip pain 11/02/2021   Encounter for general adult medical examination with abnormal findings 11/02/2021   Elevated LFTs 06/03/2021   Family history of ovarian carcinoma 06/05/2018     REFERRING PROVIDER: Huel Cote, MD  REFERRING DIAG: M16.11 (ICD-10-CM) - Unilateral primary osteoarthritis, right hip  post op R hip labral repair   THERAPY DIAG:  Pain in right hip  Stiffness of right hip, not elsewhere classified  Difficulty in walking, not elsewhere classified  Muscle weakness (generalized)  Rationale for Evaluation and Treatment: Rehabilitation  ONSET DATE: 04/01/2023 DOS  SUBJECTIVE:   SUBJECTIVE STATEMENT: Pt is 6 weeks and 3 days s/p R hip labral repair.  Pt states she uses her bike daily and tries to do it 2x/daily.  Pt reports no pain at rest and 4/10 pain with ambulation.  Pt states she still favors that side and not walking normally.  Pt feels that she was making more progress a couple of weeks ago, but hasn't made as much progress this past week.   Pt sees MD today after PT.    PERTINENT HISTORY: R hip labral repair on 04/01/2023.   PAIN:  Are you having pain? Yes 2/10 current, 4/10 worst, 0/10 Location:  R hip  PRECAUTIONS: Other: per surgical protocol  WEIGHT BEARING RESTRICTIONS: Yes TDWB'ing x 2 weeks  FALLS:  Has patient fallen in last 6 months? 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    PLOF: Independent.  Pt was able to perform her ADLs/IADLs and functional mobility skills independently.  Pt ambulated without an AD.  Pt was walking  2-4 miles 5x/wk.  Pt was also performing strength training.   PATIENT GOALS: return to her PLOF.  To be able to perform her walking program.   NEXT MD VISIT: 04/16/2023  OBJECTIVE:   DIAGNOSTIC FINDINGS: Pt is post op.  She did have an x ray and MRI prior to surgery.  Pt did have some cartilage loss per MRI.   PATIENT SURVEYS:  FOTO 18 with a goal of 60 at visit #21. 7/12: FOTO:55%     TODAY'S TREATMENT:  Treatment                            7/19:  Upright bike x 5 mins lvl 0 Step ups 4 inch step 3x10 Mini squats 2x10 Sidestepping full hall x 2 Retro walking 1/2 the hall x2 bridges 1x10 , staggered 1x10 S/L clams 2x10 Prone hip extension 2x10 LAQ 2x10 with 5#  Pt received R hip PROM in flexion, abd, ER, and IR per protocol and pt and tissue tolerance.    Treatment                            7/12:  FOTO: 55%  -PROM R hip -Joint mobilizations (distraction grade II and posterior glides grade II-III) Hooklying butterfly stretch 20sx3 -bridges 1x10 , staggered 1x10 -Sidelying clams x20 -Sidelying reverse clams x20 -prone hip extension 3# 3x10  with knee flexed   -LAQ 5#- 5" hold 3x10 -Partial squats 2x10 -Gait -347ft no crutch -retro walking- 1/2 hallx2 -lateral walking-full hall x2   Treatment                            7/5:  -PROM R hip -STM anterior hip -bridges 1x10 , staggered 1x10 -Sidelying clams rtb 3x10 -Sidelying reverse clams 3x10 -prone hip extension 2# 3x10 -prone HSC 5# 3x10  -LAQ 5#- 5" hold 3x10 -Partial squats 2x10 -Gait -367ft no crutch -retro walking- 1/2 hallx2 -lateral walking-full hall x2   Treatment                            7/1:  -PROM R hip -STM anterior hip2 -bridges 2x10 -Sidelying clams 2x10 -Sidelying reverse clams 2x10 -prone hip extension 2x10 -Q-ped weight shifting 2x10 -short knee to tall kneel 2x10 -LAQ 4#- 5" hold x20 (increase next time) -Partial squats x10 -Gait in hall with/withut crutch x1/2 hall each    PATIENT EDUCATION:  Education details:  PT instructed pt to avoid any exercises or activities that causes her to feel the pinching sensation in hip.  PT answered pt's questions.  Anatomy of condition, POC, HEP, and exercise form/rationale. Person educated: Patient  Education method: Explanation, Demonstration, Tactile cues, Verbal cues Education comprehension: verbalized understanding, returned demonstration, verbal cues required, tactile cues required  HOME EXERCISE PROGRAM: Access Code: [email or web address] URL: [email or web address]   ASSESSMENT:  CLINICAL IMPRESSION: Pt is progressing well with ROM, strength, and mobility.  She is progressing appropriately with protocol.  PT added 4 inch step ups today and Pt performed with good control without any pain.  PT instructed pt in appropriate depth of partial squats.  She performed exercises per protocol well without c/o's.  Pt had good mobility with PROM and denies any pinching sensation.  She responded well to Rx reporting no pain after Rx.  Pt should continue to benefit from cont skilled PT services per  protocol to address ongoing goals and impairments and to improve overall function.   OBJECTIVE IMPAIRMENTS: Abnormal gait, decreased activity tolerance, decreased mobility, difficulty walking, decreased ROM, decreased strength, hypomobility, impaired flexibility, and pain.   ACTIVITY LIMITATIONS: lifting, bending, standing, squatting, stairs, transfers, dressing, and locomotion level  PARTICIPATION LIMITATIONS: meal prep, cleaning, laundry, driving, shopping, community activity, and occupation  PERSONAL FACTORS:    REHAB POTENTIAL: Good  CLINICAL DECISION MAKING: Stable/uncomplicated  EVALUATION COMPLEXITY: Low   GOALS:  SHORT TERM GOALS:  Pt will be independent and compliant with HEP for improved pain, strength, ROM, and function.  Baseline: Goal status: MET 7/1 Target date:  05/05/2023   2.  Pt will progress with hip PROM per protocol for improved stiffness and mobility.   Baseline:  Goal status: achieved Target date:  05/12/2023  3.   Pt will wean off of crutches and out of brace without adverse effects.  Baseline:  Goal status: GOAL MET Target date:  05/13/2023  4.  Pt will progress with exercises per protocol without adverse effects for improved strength and mobility.  Baseline:  Goal status: PROGRESSING Target date: 05/26/2023   5. Pt will perform a 6 inch step up with good form and good control without UE support.   Baseline:  Goal status: INITIAL Target date:  05/27/2023   6.  Pt will have no pain and demo good form with squatting for improved function strength and to assist with functional mobility.  Baseline:  Goal status: INITIAL Target date: 06/24/2023  7.  Pt will ambulate with a normalized heel to toe gait pattern without limping.  Baseline:  Goal status: INITIAL Target date:  06/17/2023  8.  Pt will be able to perform work activities without adverse effects.  Baseline:  Goal status: INITIAL Target date: 06/17/2023      LONG TERM GOALS: Target  date: 07/28/2023   Pt's L hip AROM will be [facility] for improved stiffness and daily mobility.  Baseline:  Goal status: INITIAL  2.   Pt will ambulate extended community distance without increased pain and significant difficulty.  Baseline:  Goal status: INITIAL  3.  Pt will be able to ascend and descend stairs with a reciprocal gait with good control.  Baseline: INITIAL   4.  Pt will be able to perform her normal standing activities and household chores without significant pain.   Baseline: INITIAL  5.  Pt will demo 4+/5 strength in L hip flex, abd, ext and knee ext/flex for improved performance of and tolerance with functional mobility  Baseline:  Goal status: INITIAL     PLAN:  PT FREQUENCY: 1x/wk for 3-4 weeks and 2x/wk afterwards.  PT DURATION: other: 16 weeks  PLANNED INTERVENTIONS: Therapeutic exercises, Therapeutic activity, Neuromuscular re-education, Balance training, Gait training, Patient/Family education, Self Care, Joint mobilization, Stair training, DME instructions, Aquatic Therapy, Dry Needling, Electrical stimulation, Spinal mobilization, Cryotherapy, Moist heat, scar mobilization, Taping, Ultrasound, Manual therapy, and Re-evaluation  PLAN FOR NEXT SESSION: Cont per Dr. Serena Croissant hip labral repair protocol.    Audie Clear III PT, DPT 05/17/23 1:31 AM

## 2023-05-16 NOTE — Progress Notes (Signed)
Post Operative Evaluation    Procedure/Date of Surgery: Status post right hip arthroscopy 6/4  Interval History:    Presents today for follow-up 2 weeks status post above procedure.  Overall she is doing remarkably well.  She is now walking without assistive devices. Just mild soreness.   PMH/PSH/Family History/Social History/Meds/Allergies:    Past Medical History:  Diagnosis Date   Labral tear of hip joint    right   Past Surgical History:  Procedure Laterality Date   CHOLECYSTECTOMY     Social History   Socioeconomic History   Marital status: Married    Spouse name: Not on file   Number of children: 2   Years of education: Not on file   Highest education level: Not on file  Occupational History   Not on file  Tobacco Use   Smoking status: Never   Smokeless tobacco: Never  Vaping Use   Vaping status: Never Used  Substance and Sexual Activity   Alcohol use: Yes    Alcohol/week: 1.0 standard drink of alcohol    Types: 1 Glasses of wine per week    Comment: Socially   Drug use: Never   Sexual activity: Not on file  Other Topics Concern   Not on file  Social History Narrative   Not on file   Social Determinants of Health   Financial Resource Strain: Not on file  Food Insecurity: Not on file  Transportation Needs: Not on file  Physical Activity: Not on file  Stress: Not on file  Social Connections: Not on file   Family History  Problem Relation Age of Onset   Ovarian cancer Mother    Heart failure Father    No Known Allergies Current Outpatient Medications  Medication Sig Dispense Refill   aspirin EC 325 MG tablet Take 1 tablet (325 mg total) by mouth daily. 14 tablet 0   betamethasone dipropionate 0.05 % cream Apply 1 Application topically 2 (two) times daily.     buPROPion (WELLBUTRIN XL) 300 MG 24 hr tablet Take 1 tablet (300 mg total) by mouth daily. 90 tablet 1   clindamycin (CLEOCIN T) 1 % SWAB Apply 1  Application topically daily.     Glycerin-Hypromellose-PEG 400 (DRY EYE RELIEF DROPS OP) Place 1 drop into both eyes daily as needed (Dry eye).     Multiple Vitamins-Minerals (MULTIVITAMIN WITH MINERALS) tablet Take 1 tablet by mouth daily.     oxyCODONE (ROXICODONE) 5 MG immediate release tablet Take 1 tablet (5 mg total) by mouth every 4 (four) hours as needed for severe pain or breakthrough pain. (Patient not taking: Reported on 04/08/2023) 10 tablet 0   No current facility-administered medications for this visit.   No results found.  Review of Systems:   A ROS was performed including pertinent positives and negatives as documented in the HPI.   Musculoskeletal Exam:    There were no vitals taken for this visit.  Right hip with 30 degrees internal/external rotation without pain.  There is some hip abduction weakness.  Sensation is intact distally.  No palpable cords.  No swelling of the right calf.  Negative Homan  Imaging:      I personally reviewed and interpreted the radiographs.   Assessment:   6 weeks status post right hip arthroscopic labral repair overall doing extremely  well.  At today's visit she may continue with her crutches she will continue to progress with PT.  Plan :    -Return to clinic in 6 weeks      I personally saw and evaluated the patient, and participated in the management and treatment plan.  Huel Cote, MD Attending Physician, Orthopedic Surgery  This document was dictated using Dragon voice recognition software. A reasonable attempt at proof reading has been made to minimize errors.

## 2023-05-17 ENCOUNTER — Encounter (HOSPITAL_BASED_OUTPATIENT_CLINIC_OR_DEPARTMENT_OTHER): Payer: Self-pay | Admitting: Physical Therapy

## 2023-05-23 ENCOUNTER — Ambulatory Visit (HOSPITAL_BASED_OUTPATIENT_CLINIC_OR_DEPARTMENT_OTHER): Payer: BC Managed Care – PPO

## 2023-05-23 ENCOUNTER — Encounter (HOSPITAL_BASED_OUTPATIENT_CLINIC_OR_DEPARTMENT_OTHER): Payer: Self-pay

## 2023-05-23 DIAGNOSIS — R262 Difficulty in walking, not elsewhere classified: Secondary | ICD-10-CM

## 2023-05-23 DIAGNOSIS — M25651 Stiffness of right hip, not elsewhere classified: Secondary | ICD-10-CM

## 2023-05-23 DIAGNOSIS — M25551 Pain in right hip: Secondary | ICD-10-CM | POA: Diagnosis not present

## 2023-05-23 DIAGNOSIS — M6281 Muscle weakness (generalized): Secondary | ICD-10-CM

## 2023-05-23 NOTE — Therapy (Signed)
OUTPATIENT PHYSICAL THERAPY LOWER EXTREMITY TREATMENT   Patient Name: Elizabeth Rose MRN: [id number] DOB:[birth year], 42 y.o., female Today's Date: 05/23/2023  END OF SESSION:  PT End of Session - 05/23/23 0900     Visit Number 8    Number of Visits 28    Date for PT Re-Evaluation 06/30/23    Authorization Type BCBS    Authorization Time Period 02/14/23 to 03/28/23    Authorization - Number of Visits 20    PT Start Time 0849    PT Stop Time 0930    PT Time Calculation (min) 41 min    Activity Tolerance Patient tolerated treatment well    Behavior During Therapy [facility] for tasks assessed/performed                  Past Medical History:  Diagnosis Date   Labral tear of hip joint    right   Past Surgical History:  Procedure Laterality Date   CHOLECYSTECTOMY     Patient Active Problem List   Diagnosis Date Noted   Tear of right acetabular labrum 04/01/2023   Unilateral primary osteoarthritis, right hip 01/30/2023   Obesity 11/08/2022   Symptomatic mammary hypertrophy 06/07/2022   Right hip pain 11/02/2021   Encounter for general adult medical examination with abnormal findings 11/02/2021   Elevated LFTs 06/03/2021   Family history of ovarian carcinoma 06/05/2018     REFERRING PROVIDER: Huel Cote, MD  REFERRING DIAG: M16.11 (ICD-10-CM) - Unilateral primary osteoarthritis, right hip  post op R hip labral repair   THERAPY DIAG:  Pain in right hip  Stiffness of right hip, not elsewhere classified  Difficulty in walking, not elsewhere classified  Muscle weakness (generalized)  Rationale for Evaluation and Treatment: Rehabilitation  ONSET DATE: 04/01/2023 DOS  SUBJECTIVE:   SUBJECTIVE STATEMENT: Pt is 6 weeks and 3 days s/p R hip labral repair.  Pt states she uses her bike daily and tries to do it 2x/daily.  Pt reports no pain at rest and 4/10 pain with ambulation.  Pt states she still favors that side and not walking normally.  Pt feels that she was  making more progress a couple of weeks ago, but hasn't made as much progress this past week.  Pt sees MD today after PT.    PERTINENT HISTORY: R hip labral repair on 04/01/2023.   PAIN:  Are you having pain? Yes 2/10 current, 4/10 worst, 0/10 Location:  R hip  PRECAUTIONS: Other: per surgical protocol  WEIGHT BEARING RESTRICTIONS: Yes TDWB'ing x 2 weeks  FALLS:  Has patient fallen in last 6 months? 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    PLOF: Independent.  Pt was able to perform her ADLs/IADLs and functional mobility skills independently.  Pt ambulated without an AD.  Pt was walking  2-4 miles 5x/wk.  Pt was also performing strength training.   PATIENT GOALS: return to her PLOF.  To be able to perform her walking program.   NEXT MD VISIT: 04/16/2023  OBJECTIVE:   DIAGNOSTIC FINDINGS: Pt is post op.  She did have an x ray and MRI prior to surgery.  Pt did have some cartilage loss per MRI.   PATIENT SURVEYS:  FOTO 18 with a goal of 60 at visit #21. 7/12: FOTO:55%  TODAY'S TREATMENT:                                                                                                                                 Treatment                            7/26:  Upright bike x 5 mins lvl 5 Single leg bridges 2x10 R Sidelying abd taps ant/post 2x10 Sidelying abd lift with hip at 45deg flexion 2x10 Step ups 6" x5 BOSU step up/over x10 Standing hip 3 way with GTB 2x10ea/bil Hip hikes off 6" step x15ea Slide lunges 2x10  Pt received R hip PROM in flexion, abd, ER, and IR per protocol and pt and tissue tolerance.    Treatment                            7/19:  Upright bike x 5 mins lvl 0 Step ups 4 inch step 3x10 Mini squats 2x10 Sidestepping full hall x 2 Retro walking 1/2  the hall x2 bridges 1x10 , staggered 1x10 S/L clams 2x10 Prone hip extension 2x10 LAQ 2x10 with 5#  Pt received R hip PROM in flexion, abd, ER, and IR per protocol and pt and tissue tolerance.    Treatment                            7/12:  FOTO: 55%  -PROM R hip -Joint mobilizations (distraction grade II and posterior glides grade II-III) Hooklying butterfly stretch 20sx3 -bridges 1x10 , staggered 1x10 -Sidelying clams x20 -Sidelying reverse clams x20 -prone hip extension 3# 3x10 with knee flexed   -LAQ 5#- 5" hold 3x10 -Partial squats 2x10 -Gait -329ft no crutch -retro walking- 1/2 hallx2 -lateral walking-full hall x2   Treatment                            7/5:  -PROM R hip -STM anterior hip -bridges 1x10 , staggered 1x10 -Sidelying clams rtb 3x10 -Sidelying reverse clams 3x10 -prone hip extension 2# 3x10 -prone HSC 5# 3x10  -LAQ 5#- 5" hold 3x10 -Partial squats 2x10 -Gait -333ft no crutch -retro walking- 1/2 hallx2 -lateral walking-full hall x2   Treatment                            7/1:  -PROM R hip -STM anterior hip2 -bridges 2x10 -Sidelying clams 2x10 -Sidelying reverse clams 2x10 -prone hip extension 2x10 -Q-ped weight shifting 2x10 -short knee to tall kneel 2x10 -LAQ 4#- 5" hold x20 (increase next time) -Partial squats x10 -Gait in hall with/withut crutch x1/2 hall each    PATIENT EDUCATION:  Education details:  PT instructed pt to avoid any exercises or activities that  causes her to feel the pinching sensation in hip.  PT answered pt's questions.  Anatomy of condition, POC, HEP, and exercise form/rationale. Person educated: Patient  Education method: Explanation, Demonstration, Tactile cues, Verbal cues Education comprehension: verbalized understanding, returned demonstration, verbal cues required, tactile cues required  HOME EXERCISE PROGRAM: Access Code: [email or web address] URL: [email or web address]   ASSESSMENT:  CLINICAL  IMPRESSION: Pt with evident weakness and lack of endurance in lateral hip. Spent time on additional glute med focused exercises with overall good tolerance, though fatigue was present. She denied pain in hip with tasks. Overall good strength observed in other planes.Updated HEP.  OBJECTIVE IMPAIRMENTS: Abnormal gait, decreased activity tolerance, decreased mobility, difficulty walking, decreased ROM, decreased strength, hypomobility, impaired flexibility, and pain.   ACTIVITY LIMITATIONS: lifting, bending, standing, squatting, stairs, transfers, dressing, and locomotion level  PARTICIPATION LIMITATIONS: meal prep, cleaning, laundry, driving, shopping, community activity, and occupation  PERSONAL FACTORS:    REHAB POTENTIAL: Good  CLINICAL DECISION MAKING: Stable/uncomplicated  EVALUATION COMPLEXITY: Low   GOALS:   SHORT TERM GOALS:  Pt will be independent and compliant with HEP for improved pain, strength, ROM, and function.  Baseline: Goal status: MET 7/1 Target date:  05/05/2023   2.  Pt will progress with hip PROM per protocol for improved stiffness and mobility.   Baseline:  Goal status: achieved Target date:  05/12/2023  3.   Pt will wean off of crutches and out of brace without adverse effects.  Baseline:  Goal status: GOAL MET Target date:  05/13/2023  4.  Pt will progress with exercises per protocol without adverse effects for improved strength and mobility.  Baseline:  Goal status: PROGRESSING Target date: 05/26/2023   5. Pt will perform a 6 inch step up with good form and good control without UE support.   Baseline:  Goal status: INITIAL Target date:  05/27/2023   6.  Pt will have no pain and demo good form with squatting for improved function strength and to assist with functional mobility.  Baseline:  Goal status: INITIAL Target date: 06/24/2023  7.  Pt will ambulate with a normalized heel to toe gait pattern without limping.  Baseline:  Goal status:  INITIAL Target date:  06/17/2023  8.  Pt will be able to perform work activities without adverse effects.  Baseline:  Goal status: INITIAL Target date: 06/17/2023      LONG TERM GOALS: Target date: 07/28/2023   Pt's L hip AROM will be [facility] for improved stiffness and daily mobility.  Baseline:  Goal status: INITIAL  2.   Pt will ambulate extended community distance without increased pain and significant difficulty.  Baseline:  Goal status: INITIAL  3.  Pt will be able to ascend and descend stairs with a reciprocal gait with good control.  Baseline: INITIAL   4.  Pt will be able to perform her normal standing activities and household chores without significant pain.   Baseline: INITIAL  5.  Pt will demo 4+/5 strength in L hip flex, abd, ext and knee ext/flex for improved performance of and tolerance with functional mobility  Baseline:  Goal status: INITIAL     PLAN:  PT FREQUENCY: 1x/wk for 3-4 weeks and 2x/wk afterwards.  PT DURATION: other: 16 weeks  PLANNED INTERVENTIONS: Therapeutic exercises, Therapeutic activity, Neuromuscular re-education, Balance training, Gait training, Patient/Family education, Self Care, Joint mobilization, Stair training, DME instructions, Aquatic Therapy, Dry Needling, Electrical stimulation, Spinal mobilization, Cryotherapy, Moist heat, scar mobilization, Taping, Ultrasound, Manual therapy, and Re-evaluation  PLAN  FOR NEXT SESSION: Cont per Dr. Serena Croissant hip labral repair protocol.    Riki Altes, PTA  05/23/23 10:21 AM

## 2023-05-30 ENCOUNTER — Encounter (HOSPITAL_BASED_OUTPATIENT_CLINIC_OR_DEPARTMENT_OTHER): Payer: BC Managed Care – PPO | Admitting: Physical Therapy

## 2023-06-02 ENCOUNTER — Encounter (HOSPITAL_BASED_OUTPATIENT_CLINIC_OR_DEPARTMENT_OTHER): Payer: Self-pay | Admitting: Physical Therapy

## 2023-06-05 ENCOUNTER — Ambulatory Visit (HOSPITAL_BASED_OUTPATIENT_CLINIC_OR_DEPARTMENT_OTHER): Payer: BC Managed Care – PPO | Attending: Orthopaedic Surgery | Admitting: Physical Therapy

## 2023-06-05 DIAGNOSIS — M6281 Muscle weakness (generalized): Secondary | ICD-10-CM

## 2023-06-05 DIAGNOSIS — M25651 Stiffness of right hip, not elsewhere classified: Secondary | ICD-10-CM

## 2023-06-05 DIAGNOSIS — M25551 Pain in right hip: Secondary | ICD-10-CM

## 2023-06-05 DIAGNOSIS — R262 Difficulty in walking, not elsewhere classified: Secondary | ICD-10-CM

## 2023-06-05 NOTE — Therapy (Signed)
OUTPATIENT PHYSICAL THERAPY LOWER EXTREMITY TREATMENT   Patient Name: Elizabeth Rose MRN: [id number] DOB:1981/09/21, 42 y.o., female Today's Date: 06/06/2023  END OF SESSION:  PT End of Session - 06/05/23 1250     Visit Number 9    Number of Visits 28    Date for PT Re-Evaluation 06/30/23    Authorization Type BCBS    PT Start Time 1159    PT Stop Time 1245    PT Time Calculation (min) 46 min    Activity Tolerance Patient tolerated treatment well    Behavior During Therapy [facility] for tasks assessed/performed                   Past Medical History:  Diagnosis Date   Labral tear of hip joint    right   Past Surgical History:  Procedure Laterality Date   CHOLECYSTECTOMY     Patient Active Problem List   Diagnosis Date Noted   Tear of right acetabular labrum 04/01/2023   Unilateral primary osteoarthritis, right hip 01/30/2023   Obesity 11/08/2022   Symptomatic mammary hypertrophy 06/07/2022   Right hip pain 11/02/2021   Encounter for general adult medical examination with abnormal findings 11/02/2021   Elevated LFTs 06/03/2021   Family history of ovarian carcinoma 06/05/2018     REFERRING PROVIDER: Huel Cote, MD  REFERRING DIAG: M16.11 (ICD-10-CM) - Unilateral primary osteoarthritis, right hip  post op R hip labral repair   THERAPY DIAG:  Pain in right hip  Muscle weakness (generalized)  Stiffness of right hip, not elsewhere classified  Difficulty in walking, not elsewhere classified  Rationale for Evaluation and Treatment: Rehabilitation  ONSET DATE: 04/01/2023 DOS  SUBJECTIVE:   SUBJECTIVE STATEMENT: Pt is 9 weeks and 2 days s/p R hip labral repair.  Pt denies any adverse effects after prior Rx.  Pt is compliant with HEP.  Pt states she did her new HEP.  She performed her new exercises though did have pain with S/L abd taps ant/post.  Pt does have some pain and pinching with hip extension including standing and in prone. Pt states her pain is  getting worse.  Pt states it feels similar to what it did prior to surgery.  Her pain is in her groin.   Pt works 4 days per week, 8 hours per day and alternates sitting and standing.    PERTINENT HISTORY: R hip labral repair on 04/01/2023.   PAIN:  Are you having pain? Yes 5/10 current Location:  R hip  PRECAUTIONS: Other: per surgical protocol  WEIGHT BEARING RESTRICTIONS: Yes TDWB'ing x 2 weeks  FALLS:  Has patient fallen in last 6 months? 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    PLOF: Independent.  Pt was able to perform her ADLs/IADLs and functional mobility skills independently.  Pt ambulated without an AD.  Pt was walking  2-4 miles 5x/wk.  Pt was also performing strength training.   PATIENT GOALS: return to her PLOF.  To be able to perform her walking program.   NEXT MD VISIT: 04/16/2023  OBJECTIVE:   DIAGNOSTIC FINDINGS: Pt is post op.  She did have an x ray and MRI prior to surgery.  Pt did have some cartilage loss per MRI.   PATIENT SURVEYS:  FOTO 18 with a goal of  60 at visit #21. 7/12: FOTO:55%     TODAY'S TREATMENT:                                                                                                                                Reviewed pt presentation, HEP compliance, pain level, and response to prior Rx. See below for pt education  Upright bike x 5 mins lvl 5 Single leg bridges 2x10 R Sidelying hip abd 2x10 Split squats 2x10 Step ups 6" x10, 8" 2x10 Staggered bridge x 10, SL bridge with foot crossed 2x10 Lateral band walks with RTB above knees x 3 laps at rail  Pt received R hip PROM in flexion, abd, ER, and IR per protocol and pt and tissue tolerance.     PATIENT EDUCATION:  Education details:  PT spent time educating pt in avoiding  movements and exercises that irritate her hip, increase pain, or cause pinching.  PT instructed pt to decrease her hip extension exercises due to her c/o'ing of pain with those exercises.  Instructed her to stop  S/L abd taps ant/post at this time.  PT answered pt's questions.  Relevant anatomy, POC, HEP, and exercise form/rationale. Person educated: Patient  Education method: Explanation, Demonstration, Tactile cues, Verbal cues Education comprehension: verbalized understanding, returned demonstration, verbal cues required, tactile cues required  HOME EXERCISE PROGRAM: Access Code: [email or web address] URL: [email or web address]   ASSESSMENT:  CLINICAL IMPRESSION: Pt presents to Rx stating her hip pain is getting worse.  She seems to be having pain with movements that involve hip extension.  PT spent time educating pt in movements/exercises to avoid/decrease.  PT instructed pt to decrease hip extension exercises and avoid S/L'ing abd ant/post taps for now due to her c/o's and increased pain.  Pt demonstrates good understanding.  PT able to progress exercises per protocol today without pt c/o's.  She tolerated exercises well and responded well to rx reporting improved pain from 5/10 to 3/10 after Rx.  OBJECTIVE IMPAIRMENTS: Abnormal gait, decreased activity tolerance, decreased mobility, difficulty walking, decreased ROM, decreased strength, hypomobility, impaired flexibility, and pain.   ACTIVITY LIMITATIONS: lifting, bending, standing, squatting, stairs, transfers, dressing, and locomotion level  PARTICIPATION LIMITATIONS: meal prep, cleaning, laundry, driving, shopping, community activity, and occupation  PERSONAL FACTORS:    REHAB POTENTIAL: Good  CLINICAL DECISION MAKING: Stable/uncomplicated  EVALUATION COMPLEXITY: Low   GOALS:   SHORT TERM GOALS:  Pt will be independent and compliant with HEP for improved pain, strength, ROM, and function.  Baseline: Goal status: MET  7/1 Target date:  05/05/2023   2.  Pt will progress with hip PROM per protocol for improved stiffness and mobility.   Baseline:  Goal status: achieved Target date:  05/12/2023  3.   Pt will wean off of crutches and out of brace without adverse effects.  Baseline:  Goal status: GOAL MET Target date:  05/13/2023  4.  Pt will progress with exercises per protocol without adverse  effects for improved strength and mobility.  Baseline:  Goal status: PROGRESSING Target date: 05/26/2023   5. Pt will perform a 6 inch step up with good form and good control without UE support.   Baseline:  Goal status: INITIAL Target date:  05/27/2023   6.  Pt will have no pain and demo good form with squatting for improved function strength and to assist with functional mobility.  Baseline:  Goal status: INITIAL Target date: 06/24/2023  7.  Pt will ambulate with a normalized heel to toe gait pattern without limping.  Baseline:  Goal status: INITIAL Target date:  06/17/2023  8.  Pt will be able to perform work activities without adverse effects.  Baseline:  Goal status: INITIAL Target date: 06/17/2023      LONG TERM GOALS: Target date: 07/28/2023   Pt's L hip AROM will be [facility] for improved stiffness and daily mobility.  Baseline:  Goal status: INITIAL  2.   Pt will ambulate extended community distance without increased pain and significant difficulty.  Baseline:  Goal status: INITIAL  3.  Pt will be able to ascend and descend stairs with a reciprocal gait with good control.  Baseline: INITIAL   4.  Pt will be able to perform her normal standing activities and household chores without significant pain.   Baseline: INITIAL  5.  Pt will demo 4+/5 strength in L hip flex, abd, ext and knee ext/flex for improved performance of and tolerance with functional mobility  Baseline:  Goal status: INITIAL     PLAN:  PT FREQUENCY: 1x/wk for 3-4 weeks and 2x/wk afterwards.  PT DURATION: other: 16  weeks  PLANNED INTERVENTIONS: Therapeutic exercises, Therapeutic activity, Neuromuscular re-education, Balance training, Gait training, Patient/Family education, Self Care, Joint mobilization, Stair training, DME instructions, Aquatic Therapy, Dry Needling, Electrical stimulation, Spinal mobilization, Cryotherapy, Moist heat, scar mobilization, Taping, Ultrasound, Manual therapy, and Re-evaluation  PLAN FOR NEXT SESSION: Cont per Dr. Serena Croissant hip labral repair protocol.  Monitor pt's pain and sx's including response to decreasing HEP.  Audie Clear III PT, DPT 06/06/23 1:47 PM

## 2023-06-06 ENCOUNTER — Ambulatory Visit (HOSPITAL_BASED_OUTPATIENT_CLINIC_OR_DEPARTMENT_OTHER): Payer: BC Managed Care – PPO | Admitting: Physical Therapy

## 2023-06-06 ENCOUNTER — Encounter (HOSPITAL_BASED_OUTPATIENT_CLINIC_OR_DEPARTMENT_OTHER): Payer: Self-pay | Admitting: Physical Therapy

## 2023-06-06 ENCOUNTER — Other Ambulatory Visit: Payer: Self-pay | Admitting: Internal Medicine

## 2023-06-13 ENCOUNTER — Encounter (HOSPITAL_BASED_OUTPATIENT_CLINIC_OR_DEPARTMENT_OTHER): Payer: Self-pay

## 2023-06-13 ENCOUNTER — Ambulatory Visit (HOSPITAL_BASED_OUTPATIENT_CLINIC_OR_DEPARTMENT_OTHER): Payer: BC Managed Care – PPO

## 2023-06-13 DIAGNOSIS — M25551 Pain in right hip: Secondary | ICD-10-CM | POA: Diagnosis not present

## 2023-06-13 DIAGNOSIS — R262 Difficulty in walking, not elsewhere classified: Secondary | ICD-10-CM

## 2023-06-13 DIAGNOSIS — M25651 Stiffness of right hip, not elsewhere classified: Secondary | ICD-10-CM

## 2023-06-13 DIAGNOSIS — M6281 Muscle weakness (generalized): Secondary | ICD-10-CM

## 2023-06-13 NOTE — Therapy (Addendum)
OUTPATIENT PHYSICAL THERAPY LOWER EXTREMITY TREATMENT PHYSICAL THERAPY DISCHARGE SUMMARY  Visits from Start of Care: 10  Current functional level related to goals / functional outcomes: improving   Remaining deficits: unknown   Education / Equipment: Management of condition; HEP   Patient agrees to discharge. Patient goals were partially met. Patient is being discharged due to not returning since the last visit.  Corrie Dandy Donaldson) Ziemba MPT 12/18/23 11:56 AM [facility] [address] [place], [place], [address] Phone: [phone]   Fax:  [phone]    Patient Name: Elizabeth Rose MRN: [id number] DOB:1981-09-26, 42 y.o., female Today's Date: 06/13/2023  END OF SESSION:  PT End of Session - 06/13/23 0848     Visit Number 10    Number of Visits 28    Date for PT Re-Evaluation 06/30/23    Authorization Type BCBS    Authorization Time Period 02/14/23 to 03/28/23    Authorization - Number of Visits 20    PT Start Time 0848    PT Stop Time 0930    PT Time Calculation (min) 42 min    Activity Tolerance Patient tolerated treatment well    Behavior During Therapy [facility] for tasks assessed/performed                    Past Medical History:  Diagnosis Date   Labral tear of hip joint    right   Past Surgical History:  Procedure Laterality Date   CHOLECYSTECTOMY     Patient Active Problem List   Diagnosis Date Noted   Tear of right acetabular labrum 04/01/2023   Unilateral primary osteoarthritis, right hip 01/30/2023   Obesity 11/08/2022   Symptomatic mammary hypertrophy 06/07/2022   Right hip pain 11/02/2021   Encounter for general adult medical examination with abnormal findings 11/02/2021   Elevated LFTs 06/03/2021   Family history of ovarian carcinoma 06/05/2018     REFERRING PROVIDER: Huel Cote, MD  REFERRING DIAG: M16.11 (ICD-10-CM) - Unilateral primary osteoarthritis, right hip  post op R  hip labral repair   THERAPY DIAG:  Pain in right hip  Muscle weakness (generalized)  Stiffness of right hip, not elsewhere classified  Difficulty in walking, not elsewhere classified  Rationale for Evaluation and Treatment: Rehabilitation  ONSET DATE: 04/01/2023 DOS  SUBJECTIVE:   SUBJECTIVE STATEMENT: Pt is 10 weeks and 3 days s/p R hip labral repair. Pt reports she has modified her HEP as instructed by PT at last session, which has significantly improved her pain level. "This is the best it has felt since surgery."    PERTINENT HISTORY: R hip labral repair on 04/01/2023.   PAIN:  Are you having pain? Yes 2/10 current Location:  R hip  PRECAUTIONS: Other: per surgical protocol  WEIGHT BEARING RESTRICTIONS: Yes TDWB'ing x 2 weeks  FALLS:  Has patient fallen in last 6 months? 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    PLOF: Independent.  Pt was able to perform her ADLs/IADLs and functional mobility skills independently.  Pt ambulated without an AD.  Pt was walking  2-4 miles 5x/wk.  Pt was also performing strength training.   PATIENT GOALS: return to her PLOF.  To be able to perform her walking program.   NEXT MD VISIT: 04/16/2023  OBJECTIVE:  DIAGNOSTIC FINDINGS: Pt is post op.  She did have an x ray and MRI prior to surgery.  Pt did have some cartilage loss per MRI.   PATIENT SURVEYS:  FOTO 18 with a goal of 60 at visit #21. 7/12: FOTO:55%     TODAY'S TREATMENT:                                                                                                                                 Upright bike x 5 mins lvl 5 Single leg bridges 2x10 R Sidelying hip abd 2x10 R Stairs- full flight reciprocal Split squats 2x10ea Full squat at rail x20 Step ups fwd 8"  2x10, lateral 6" 2x10   Pt received R hip PROM in flexion, abd, ER, and IR per protocol and pt and tissue tolerance.   STM to anterior hip-proximal quads/hip flexors/adductors  PATIENT EDUCATION:  Education details:  PT spent time educating pt in avoiding movements and exercises that irritate her hip, increase pain, or cause pinching.  PT instructed pt to decrease her hip extension exercises due to her c/o'ing of pain with those exercises.  Instructed her to stop  S/L abd taps ant/post at this time.  PT answered pt's questions.  Relevant anatomy, POC, HEP, and exercise form/rationale. Person educated: Patient  Education method: Explanation, Demonstration, Tactile cues, Verbal cues Education comprehension: verbalized understanding, returned demonstration, verbal cues required, tactile cues required  HOME EXERCISE PROGRAM: Access Code: [email or web address] URL: [email or web address]   ASSESSMENT:  CLINICAL IMPRESSION: Continued to avoid extension based strengthening as pt appears to be improving in her pain level since modifying this. She denied pain with all exercises today. Some tenderness in medial/anterior hip musculature, so spent time on MT to this area. Minimal tightness noted with PROM and no report of discomfort.  OBJECTIVE IMPAIRMENTS: Abnormal gait, decreased activity tolerance, decreased mobility, difficulty walking, decreased ROM, decreased strength, hypomobility, impaired flexibility, and pain.   ACTIVITY LIMITATIONS: lifting, bending, standing, squatting, stairs, transfers, dressing, and locomotion level  PARTICIPATION LIMITATIONS: meal prep, cleaning, laundry, driving, shopping, community activity, and occupation  PERSONAL FACTORS:    REHAB POTENTIAL: Good  CLINICAL DECISION MAKING: Stable/uncomplicated  EVALUATION COMPLEXITY: Low   GOALS:   SHORT TERM GOALS:  Pt will be independent and compliant with HEP for improved pain, strength, ROM, and function.   Baseline: Goal status: MET 7/1 Target date:  05/05/2023   2.  Pt will progress with hip PROM per protocol for improved stiffness and mobility.   Baseline:  Goal status: achieved Target date:  05/12/2023  3.   Pt will wean off of crutches and out of brace without adverse effects.  Baseline:  Goal status: GOAL MET Target date:  05/13/2023  4.  Pt will progress with exercises per protocol without adverse effects for improved strength and mobility.  Baseline:  Goal status: PROGRESSING Target date: 05/26/2023   5. Pt will perform a 6 inch step up with  good form and good control without UE support.   Baseline:  Goal status: INITIAL Target date:  05/27/2023   6.  Pt will have no pain and demo good form with squatting for improved function strength and to assist with functional mobility.  Baseline:  Goal status: INITIAL Target date: 06/24/2023  7.  Pt will ambulate with a normalized heel to toe gait pattern without limping.  Baseline:  Goal status: INITIAL Target date:  06/17/2023  8.  Pt will be able to perform work activities without adverse effects.  Baseline:  Goal status: INITIAL Target date: 06/17/2023      LONG TERM GOALS: Target date: 07/28/2023   Pt's L hip AROM will be [facility] for improved stiffness and daily mobility.  Baseline:  Goal status: INITIAL  2.   Pt will ambulate extended community distance without increased pain and significant difficulty.  Baseline:  Goal status: INITIAL  3.  Pt will be able to ascend and descend stairs with a reciprocal gait with good control.  Baseline: INITIAL   4.  Pt will be able to perform her normal standing activities and household chores without significant pain.   Baseline: INITIAL  5.  Pt will demo 4+/5 strength in L hip flex, abd, ext and knee ext/flex for improved performance of and tolerance with functional mobility  Baseline:  Goal status: INITIAL     PLAN:  PT FREQUENCY: 1x/wk for 3-4 weeks and 2x/wk  afterwards.  PT DURATION: other: 16 weeks  PLANNED INTERVENTIONS: Therapeutic exercises, Therapeutic activity, Neuromuscular re-education, Balance training, Gait training, Patient/Family education, Self Care, Joint mobilization, Stair training, DME instructions, Aquatic Therapy, Dry Needling, Electrical stimulation, Spinal mobilization, Cryotherapy, Moist heat, scar mobilization, Taping, Ultrasound, Manual therapy, and Re-evaluation  PLAN FOR NEXT SESSION: Cont per Dr. Serena Croissant hip labral repair protocol.  Monitor pt's pain and sx's including response to decreasing HEP.  Riki Altes, PTA  06/13/23 10:04 AM

## 2023-06-19 ENCOUNTER — Ambulatory Visit (HOSPITAL_BASED_OUTPATIENT_CLINIC_OR_DEPARTMENT_OTHER): Payer: BC Managed Care – PPO

## 2023-06-20 ENCOUNTER — Ambulatory Visit (HOSPITAL_BASED_OUTPATIENT_CLINIC_OR_DEPARTMENT_OTHER): Payer: BC Managed Care – PPO

## 2023-06-27 ENCOUNTER — Ambulatory Visit (HOSPITAL_BASED_OUTPATIENT_CLINIC_OR_DEPARTMENT_OTHER): Payer: BC Managed Care – PPO | Admitting: Physical Therapy

## 2023-07-04 ENCOUNTER — Encounter (HOSPITAL_BASED_OUTPATIENT_CLINIC_OR_DEPARTMENT_OTHER): Payer: BC Managed Care – PPO | Admitting: Orthopaedic Surgery

## 2023-07-04 ENCOUNTER — Ambulatory Visit (HOSPITAL_BASED_OUTPATIENT_CLINIC_OR_DEPARTMENT_OTHER): Payer: BC Managed Care – PPO | Admitting: Orthopaedic Surgery

## 2023-07-04 DIAGNOSIS — S73191A Other sprain of right hip, initial encounter: Secondary | ICD-10-CM

## 2023-07-04 NOTE — Progress Notes (Signed)
Post Operative Evaluation    Procedure/Date of Surgery: Status post right hip arthroscopy 6/4  Interval History:    Presents today for follow-up 12 weeks status post above procedure.  Overall she is continuing to improve.  Just has some occasional soreness when sitting for longer period of time.  She is continuing to improve.  She is back up to walking 1 mile.  PMH/PSH/Family History/Social History/Meds/Allergies:    Past Medical History:  Diagnosis Date   Labral tear of hip joint    right   Past Surgical History:  Procedure Laterality Date   CHOLECYSTECTOMY     Social History   Socioeconomic History   Marital status: Married    Spouse name: Not on file   Number of children: 2   Years of education: Not on file   Highest education level: Not on file  Occupational History   Not on file  Tobacco Use   Smoking status: Never   Smokeless tobacco: Never  Vaping Use   Vaping status: Never Used  Substance and Sexual Activity   Alcohol use: Yes    Alcohol/week: 1.0 standard drink of alcohol    Types: 1 Glasses of wine per week    Comment: Socially   Drug use: Never   Sexual activity: Not on file  Other Topics Concern   Not on file  Social History Narrative   Not on file   Social Determinants of Health   Financial Resource Strain: Not on file  Food Insecurity: Not on file  Transportation Needs: Not on file  Physical Activity: Not on file  Stress: Not on file  Social Connections: Not on file   Family History  Problem Relation Age of Onset   Ovarian cancer Mother    Heart failure Father    No Known Allergies Current Outpatient Medications  Medication Sig Dispense Refill   aspirin EC 325 MG tablet Take 1 tablet (325 mg total) by mouth daily. 14 tablet 0   betamethasone dipropionate 0.05 % cream Apply 1 Application topically 2 (two) times daily.     buPROPion (WELLBUTRIN XL) 300 MG 24 hr tablet TAKE 1 TABLET BY MOUTH EVERY DAY  90 tablet 1   clindamycin (CLEOCIN T) 1 % SWAB Apply 1 Application topically daily.     Glycerin-Hypromellose-PEG 400 (DRY EYE RELIEF DROPS OP) Place 1 drop into both eyes daily as needed (Dry eye).     Multiple Vitamins-Minerals (MULTIVITAMIN WITH MINERALS) tablet Take 1 tablet by mouth daily.     oxyCODONE (ROXICODONE) 5 MG immediate release tablet Take 1 tablet (5 mg total) by mouth every 4 (four) hours as needed for severe pain or breakthrough pain. (Patient not taking: Reported on 04/08/2023) 10 tablet 0   No current facility-administered medications for this visit.   No results found.  Review of Systems:   A ROS was performed including pertinent positives and negatives as documented in the HPI.   Musculoskeletal Exam:    There were no vitals taken for this visit.  Right hip with 30 degrees internal/external rotation without pain.  There is some hip abduction weakness.  Sensation is intact distally.  No palpable cords.  No swelling of the right calf.  Negative Homan  Imaging:      I personally reviewed and interpreted the radiographs.   Assessment:  12 weeks status post right hip arthroscopic labral repair overall doing extremely well.  At this time I have advised that she may begin walking for longer distances.  I will plan to see her back in 3 months for final check  Plan :    -Return to clinic in 12 weeks      I personally saw and evaluated the patient, and participated in the management and treatment plan.  Huel Cote, MD Attending Physician, Orthopedic Surgery  This document was dictated using Dragon voice recognition software. A reasonable attempt at proof reading has been made to minimize errors.

## 2023-07-18 ENCOUNTER — Other Ambulatory Visit (HOSPITAL_BASED_OUTPATIENT_CLINIC_OR_DEPARTMENT_OTHER): Payer: Self-pay

## 2023-07-18 MED ORDER — INFLUENZA VIRUS VACC SPLIT PF (FLUZONE) 0.5 ML IM SUSY
0.5000 mL | PREFILLED_SYRINGE | Freq: Once | INTRAMUSCULAR | 0 refills | Status: AC
  Filled 2023-07-18: qty 0.5, 1d supply, fill #0

## 2023-08-05 ENCOUNTER — Encounter (HOSPITAL_BASED_OUTPATIENT_CLINIC_OR_DEPARTMENT_OTHER): Payer: Self-pay | Admitting: Obstetrics and Gynecology

## 2023-08-06 ENCOUNTER — Encounter (HOSPITAL_BASED_OUTPATIENT_CLINIC_OR_DEPARTMENT_OTHER): Payer: Self-pay | Admitting: Obstetrics and Gynecology

## 2023-08-08 ENCOUNTER — Encounter (HOSPITAL_BASED_OUTPATIENT_CLINIC_OR_DEPARTMENT_OTHER): Payer: Self-pay | Admitting: Obstetrics and Gynecology

## 2023-08-08 ENCOUNTER — Other Ambulatory Visit: Payer: Self-pay

## 2023-08-08 NOTE — Progress Notes (Signed)
Spoke w/ via phone for pre-op interview---pt Lab needs dos---- cbc, t & s, cmet, urine preg        Lab results------ COVID test -----patient states asymptomatic no test needed Arrive at -------1130 08-18-2023 NPO after MN NO Solid Food.  Clear liquids from MN until---1030 Med rec completed Medications to take morning of surgery -----Bupropion Diabetic medication -----n/a Patient instructed no nail polish to be worn day of surgery Patient instructed to bring photo id and insurance card day of surgery Patient aware to have Driver (ride ) / caregiver    for 24 hours after surgery husband todd-  Patient Special Instructions -----none Pre-Op special Instructions -----none Patient verbalized understanding of instructions that were given at this phone interview. Patient denies chest pain, sob, fever, cough at the interview.

## 2023-08-17 NOTE — H&P (Signed)
Elizabeth Rose is an 42 y.o. female G2P2 with undesired fertility and family history of ovarian cancer (in mother) for Bilateral salpingectomy.  D/w pt r/b/a of surgery also process and expectations.    Pertinent Gynecological History: G2P2  S/p Novasure Vasec for contra No abn pap, last 08/2020 HR HPV neg No STDs  Menstrual History:  No LMP recorded. Patient has had an ablation.    Past Medical History:  Diagnosis Date   Family history of ovarian cancer     Past Surgical History:  Procedure Laterality Date   CHOLECYSTECTOMY  2000   HIP ARTHROSCOPY W/ LABRAL REPAIR Right 04/01/2023   @[facility]   by dr Huel Cote   HYSTEROSCOPY WITH NOVASURE  08/10/2010   @[facility]  by dr Nino Parsley;   D&C Hysteroscopy w/ Endometrial Ablation w/ Novasure    Family History  Problem Relation Age of Onset   Ovarian cancer Mother    Heart failure Father     Social History:  reports that she has never smoked. She has never used smokeless tobacco. She reports current alcohol use of about 1.0 standard drink of alcohol per week. She reports that she does not use drugs.married, front office at dentist  Allergies: No Known Allergies  Meds: bupropion 300mg  qd  Review of Systems  Constitutional: Negative.   HENT: Negative.    Eyes: Negative.   Respiratory: Negative.    Cardiovascular: Negative.   Gastrointestinal: Negative.   Genitourinary: Negative.   Musculoskeletal: Negative.   Skin: Negative.   Neurological: Negative.   Psychiatric/Behavioral: Negative.      Height 5\' 1"  (1.549 m), weight 71.2 kg. Physical Exam Constitutional:      Appearance: Normal appearance.  HENT:     Head: Normocephalic and atraumatic.  Cardiovascular:     Rate and Rhythm: Normal rate and regular rhythm.  Pulmonary:     Effort: Pulmonary effort is normal.     Breath sounds: Normal breath sounds.  Abdominal:     General: Bowel sounds are normal.     Palpations: Abdomen is soft.  Genitourinary:    General:  Normal vulva.     Rectum: Normal.  Musculoskeletal:        General: Normal range of motion.     Cervical back: Normal range of motion and neck supple.  Skin:    General: Skin is warm and dry.  Neurological:     General: No focal deficit present.     Mental Status: She is alert and oriented to person, place, and time.  Psychiatric:        Mood and Affect: Mood normal.        Behavior: Behavior normal.     Assessment/Plan: 42yo G2P2 for RA BS  Will proceed  Derald Lorge Bovard-Stuckert 08/17/2023, 7:17 PM

## 2023-08-17 NOTE — Plan of Care (Signed)
[facility] Tonsillectomy/Adenoidectomy, Postoperative PEDS care plan entered in error.

## 2023-08-18 ENCOUNTER — Ambulatory Visit (HOSPITAL_BASED_OUTPATIENT_CLINIC_OR_DEPARTMENT_OTHER)
Admission: RE | Admit: 2023-08-18 | Discharge: 2023-08-18 | Disposition: A | Discharge status: Home / Self Care | Payer: BC Managed Care – PPO | Attending: Obstetrics and Gynecology | Admitting: Obstetrics and Gynecology

## 2023-08-18 ENCOUNTER — Other Ambulatory Visit: Payer: Self-pay

## 2023-08-18 ENCOUNTER — Encounter (HOSPITAL_BASED_OUTPATIENT_CLINIC_OR_DEPARTMENT_OTHER): Payer: Self-pay | Admitting: Obstetrics and Gynecology

## 2023-08-18 ENCOUNTER — Ambulatory Visit (HOSPITAL_BASED_OUTPATIENT_CLINIC_OR_DEPARTMENT_OTHER): Payer: BC Managed Care – PPO | Admitting: Anesthesiology

## 2023-08-18 ENCOUNTER — Encounter (HOSPITAL_BASED_OUTPATIENT_CLINIC_OR_DEPARTMENT_OTHER)
Admission: RE | Disposition: A | Discharge status: Home / Self Care | Payer: Self-pay | Source: Home / Self Care | Attending: Obstetrics and Gynecology

## 2023-08-18 DIAGNOSIS — Z302 Encounter for sterilization: Secondary | ICD-10-CM | POA: Diagnosis present

## 2023-08-18 DIAGNOSIS — Z8041 Family history of malignant neoplasm of ovary: Secondary | ICD-10-CM | POA: Diagnosis not present

## 2023-08-18 DIAGNOSIS — Z9851 Tubal ligation status: Secondary | ICD-10-CM

## 2023-08-18 DIAGNOSIS — Z01818 Encounter for other preprocedural examination: Secondary | ICD-10-CM

## 2023-08-18 HISTORY — DX: Family history of malignant neoplasm of ovary: Z80.41

## 2023-08-18 HISTORY — PX: XI ROBOTIC ASSISTED SALPINGECTOMY: SHX6824

## 2023-08-18 LAB — TYPE AND SCREEN
ABO/RH(D): B NEG
Antibody Screen: NEGATIVE

## 2023-08-18 LAB — COMPREHENSIVE METABOLIC PANEL
ALT: 26 U/L (ref 0–44)
AST: 20 U/L (ref 15–41)
Albumin: 4.3 g/dL (ref 3.5–5.0)
Alkaline Phosphatase: 80 U/L (ref 38–126)
Anion gap: 9 (ref 5–15)
BUN: 8 mg/dL (ref 6–20)
CO2: 26 mmol/L (ref 22–32)
Calcium: 9 mg/dL (ref 8.9–10.3)
Chloride: 102 mmol/L (ref 98–111)
Creatinine, Ser: 0.79 mg/dL (ref 0.44–1.00)
GFR, Estimated: >60 mL/min (ref >60)
Glucose, Bld: 95 mg/dL (ref 70–99)
Potassium: 3.6 mmol/L (ref 3.5–5.1)
Sodium: 137 mmol/L (ref 135–145)
Total Bilirubin: 0.8 mg/dL (ref 0.3–1.2)
Total Protein: 7.5 g/dL (ref 6.5–8.1)

## 2023-08-18 LAB — CBC
HCT: 42.6 % (ref 36.0–46.0)
Hemoglobin: 14 g/dL (ref 12.0–15.0)
MCH: 28.6 pg (ref 26.0–34.0)
MCHC: 32.9 g/dL (ref 30.0–36.0)
MCV: 86.9 fL (ref 80.0–100.0)
Platelets: 251 10*3/uL (ref 150–400)
RBC: 4.9 MIL/uL (ref 3.87–5.11)
RDW: 12.1 % (ref 11.5–15.5)
WBC: 7.9 10*3/uL (ref 4.0–10.5)
nRBC: 0 % (ref 0.0–0.2)

## 2023-08-18 LAB — ABO/RH: ABO/RH(D): B NEG

## 2023-08-18 LAB — POCT PREGNANCY, URINE: Preg Test, Ur: NEGATIVE

## 2023-08-18 SURGERY — SALPINGECTOMY, ROBOT-ASSISTED
Anesthesia: General | Site: Pelvis | Laterality: Bilateral

## 2023-08-18 MED ORDER — PROPOFOL 10 MG/ML IV BOLUS
INTRAVENOUS | Status: AC
  Filled 2023-08-18: qty 20

## 2023-08-18 MED ORDER — ROCURONIUM BROMIDE 10 MG/ML (PF) SYRINGE
PREFILLED_SYRINGE | INTRAVENOUS | Status: DC | PRN
  Administered 2023-08-18: 40 mg via INTRAVENOUS

## 2023-08-18 MED ORDER — ONDANSETRON HCL 4 MG/2ML IJ SOLN
INTRAMUSCULAR | Status: AC
  Filled 2023-08-18: qty 2

## 2023-08-18 MED ORDER — FENTANYL CITRATE (PF) 100 MCG/2ML IJ SOLN
INTRAMUSCULAR | Status: DC | PRN
  Administered 2023-08-18 (×2): 50 ug via INTRAVENOUS

## 2023-08-18 MED ORDER — STERILE WATER FOR IRRIGATION IR SOLN
Status: DC | PRN
  Administered 2023-08-18: 500 mL

## 2023-08-18 MED ORDER — DEXAMETHASONE SODIUM PHOSPHATE 10 MG/ML IJ SOLN
INTRAMUSCULAR | Status: DC | PRN
  Administered 2023-08-18: 10 mg via INTRAVENOUS

## 2023-08-18 MED ORDER — FENTANYL CITRATE (PF) 100 MCG/2ML IJ SOLN
INTRAMUSCULAR | Status: AC
  Filled 2023-08-18: qty 2

## 2023-08-18 MED ORDER — OXYCODONE HCL 5 MG PO TABS
5.0000 mg | ORAL_TABLET | Freq: Once | ORAL | Status: AC | PRN
  Administered 2023-08-18: 5 mg via ORAL

## 2023-08-18 MED ORDER — LACTATED RINGERS IV SOLN: INTRAVENOUS | Status: DC

## 2023-08-18 MED ORDER — KETOROLAC TROMETHAMINE 30 MG/ML IJ SOLN
INTRAMUSCULAR | Status: DC | PRN
  Administered 2023-08-18: 30 mg via INTRAVENOUS

## 2023-08-18 MED ORDER — DEXAMETHASONE SODIUM PHOSPHATE 10 MG/ML IJ SOLN
INTRAMUSCULAR | Status: AC
  Filled 2023-08-18: qty 1

## 2023-08-18 MED ORDER — LIDOCAINE HCL (PF) 2 % IJ SOLN
INTRAMUSCULAR | Status: AC
  Filled 2023-08-18: qty 5

## 2023-08-18 MED ORDER — MIDAZOLAM HCL 5 MG/5ML IJ SOLN
INTRAMUSCULAR | Status: DC | PRN
  Administered 2023-08-18: 2 mg via INTRAVENOUS

## 2023-08-18 MED ORDER — MIDAZOLAM HCL 2 MG/2ML IJ SOLN
INTRAMUSCULAR | Status: AC
  Filled 2023-08-18: qty 2

## 2023-08-18 MED ORDER — MEPERIDINE HCL 25 MG/ML IJ SOLN
6.2500 mg | INTRAMUSCULAR | Status: DC | PRN
Start: 2023-08-18 — End: 2023-08-18

## 2023-08-18 MED ORDER — SUGAMMADEX SODIUM 200 MG/2ML IV SOLN
INTRAVENOUS | Status: DC | PRN
  Administered 2023-08-18: 150 mg via INTRAVENOUS

## 2023-08-18 MED ORDER — LIDOCAINE 2% (20 MG/ML) 5 ML SYRINGE
INTRAMUSCULAR | Status: DC | PRN
  Administered 2023-08-18: 100 mg via INTRAVENOUS

## 2023-08-18 MED ORDER — ROCURONIUM BROMIDE 10 MG/ML (PF) SYRINGE
PREFILLED_SYRINGE | INTRAVENOUS | Status: AC
  Filled 2023-08-18: qty 10

## 2023-08-18 MED ORDER — ACETAMINOPHEN 160 MG/5ML PO SOLN: 325.0000 mg | ORAL | Status: DC | PRN

## 2023-08-18 MED ORDER — FENTANYL CITRATE (PF) 100 MCG/2ML IJ SOLN
25.0000 ug | INTRAMUSCULAR | Status: DC | PRN
  Administered 2023-08-18: 25 ug via INTRAVENOUS

## 2023-08-18 MED ORDER — PROPOFOL 10 MG/ML IV BOLUS
INTRAVENOUS | Status: DC | PRN
  Administered 2023-08-18: 200 mg via INTRAVENOUS

## 2023-08-18 MED ORDER — CEFAZOLIN SODIUM-DEXTROSE 2-4 GM/100ML-% IV SOLN
INTRAVENOUS | Status: AC
  Filled 2023-08-18: qty 100

## 2023-08-18 MED ORDER — ONDANSETRON HCL 4 MG/2ML IJ SOLN
4.0000 mg | Freq: Once | INTRAMUSCULAR | Status: AC | PRN
  Administered 2023-08-18: 4 mg via INTRAVENOUS

## 2023-08-18 MED ORDER — CEFAZOLIN SODIUM-DEXTROSE 2-4 GM/100ML-% IV SOLN
2.0000 g | INTRAVENOUS | Status: AC
  Administered 2023-08-18: 2 g via INTRAVENOUS

## 2023-08-18 MED ORDER — ACETAMINOPHEN 325 MG PO TABS: 325.0000 mg | ORAL_TABLET | ORAL | Status: DC | PRN

## 2023-08-18 MED ORDER — ACETAMINOPHEN 500 MG PO TABS
1000.0000 mg | ORAL_TABLET | ORAL | Status: AC
  Administered 2023-08-18: 1000 mg via ORAL

## 2023-08-18 MED ORDER — KETOROLAC TROMETHAMINE 30 MG/ML IJ SOLN: 30.0000 mg | Freq: Once | INTRAMUSCULAR | Status: DC | PRN

## 2023-08-18 MED ORDER — OXYCODONE HCL 5 MG/5ML PO SOLN: 5.0000 mg | Freq: Once | ORAL | Status: AC | PRN

## 2023-08-18 MED ORDER — KETOROLAC TROMETHAMINE 30 MG/ML IJ SOLN
INTRAMUSCULAR | Status: AC
  Filled 2023-08-18: qty 1

## 2023-08-18 MED ORDER — BUPIVACAINE HCL (PF) 0.25 % IJ SOLN
INTRAMUSCULAR | Status: DC | PRN
  Administered 2023-08-18: 12 mL

## 2023-08-18 MED ORDER — POVIDONE-IODINE 10 % EX SWAB: 2.0000 | Freq: Once | CUTANEOUS | Status: DC

## 2023-08-18 MED ORDER — ONDANSETRON HCL 4 MG/2ML IJ SOLN
INTRAMUSCULAR | Status: DC | PRN
  Administered 2023-08-18: 4 mg via INTRAVENOUS

## 2023-08-18 MED ORDER — ACETAMINOPHEN 500 MG PO TABS
ORAL_TABLET | ORAL | Status: AC
  Filled 2023-08-18: qty 2

## 2023-08-18 MED ORDER — WHITE PETROLATUM EX OINT
TOPICAL_OINTMENT | CUTANEOUS | Status: AC
  Filled 2023-08-18: qty 5

## 2023-08-18 MED ORDER — OXYCODONE HCL 5 MG PO TABS
ORAL_TABLET | ORAL | Status: AC
  Filled 2023-08-18: qty 1

## 2023-08-18 SURGICAL SUPPLY — 75 items
ADH SKN CLS APL DERMABOND .7 (GAUZE/BANDAGES/DRESSINGS) ×1
APL SRG 38 LTWT LNG FL B (MISCELLANEOUS)
APPLICATOR ARISTA FLEXITIP XL (MISCELLANEOUS) IMPLANT
CANNULA CAP OBTURATR AIRSEAL 8 (CAP) ×2 IMPLANT
CANNULA REDUCER 12-8 DVNC XI (CANNULA) IMPLANT
CATH FOLEY 3WAY 5CC 16FR (CATHETERS) ×2 IMPLANT
COVER BACK TABLE 60X90IN (DRAPES) ×2 IMPLANT
COVER TIP SHEARS 8 DVNC (MISCELLANEOUS) ×2 IMPLANT
DEFOGGER SCOPE WARMER CLEARIFY (MISCELLANEOUS) ×2 IMPLANT
DERMABOND ADVANCED .7 DNX12 (GAUZE/BANDAGES/DRESSINGS) ×2 IMPLANT
DILATOR CANAL MILEX (MISCELLANEOUS) IMPLANT
DRAPE ARM DVNC X/XI (DISPOSABLE) ×8 IMPLANT
DRAPE COLUMN DVNC XI (DISPOSABLE) ×2 IMPLANT
DRAPE SURG IRRIG POUCH 19X23 (DRAPES) ×2 IMPLANT
DRAPE UTILITY XL STRL (DRAPES) ×2 IMPLANT
DRIVER NDL MEGA SUTCUT DVNCXI (INSTRUMENTS) ×2 IMPLANT
DRIVER NDLE MEGA SUTCUT DVNCXI (INSTRUMENTS)
DURAPREP 26ML APPLICATOR (WOUND CARE) ×2 IMPLANT
ELECT REM PT RETURN 9FT ADLT (ELECTROSURGICAL) ×1
ELECTRODE REM PT RTRN 9FT ADLT (ELECTROSURGICAL) ×2 IMPLANT
FORCEPS BPLR FENES DVNC XI (FORCEP) ×2 IMPLANT
FORCEPS PROGRASP DVNC XI (FORCEP) IMPLANT
GAUZE 4X4 16PLY ~~LOC~~+RFID DBL (SPONGE) ×2 IMPLANT
GLOVE BIO SURGEON STRL SZ 6.5 (GLOVE) ×6 IMPLANT
GLOVE BIOGEL PI IND STRL 7.5 (GLOVE) IMPLANT
HEMOSTAT ARISTA ABSORB 3G PWDR (HEMOSTASIS) IMPLANT
HOLDER FOLEY CATH W/STRAP (MISCELLANEOUS) IMPLANT
IRRIG SUCT STRYKERFLOW 2 WTIP (MISCELLANEOUS)
IRRIGATION SUCT STRKRFLW 2 WTP (MISCELLANEOUS) ×2 IMPLANT
KIT PINK PAD W/HEAD ARE REST (MISCELLANEOUS) ×1
KIT PINK PAD W/HEAD ARM REST (MISCELLANEOUS) ×2 IMPLANT
KIT TURNOVER CYSTO (KITS) ×2 IMPLANT
LEGGING LITHOTOMY PAIR STRL (DRAPES) ×2 IMPLANT
MANIFOLD NEPTUNE II (INSTRUMENTS) ×2 IMPLANT
MANIPULATOR ADVINCU DEL 2.5 PL (MISCELLANEOUS) IMPLANT
MANIPULATOR ADVINCU DEL 3.0 PL (MISCELLANEOUS) IMPLANT
MANIPULATOR ADVINCU DEL 3.5 PL (MISCELLANEOUS) IMPLANT
MANIPULATOR ADVINCU DEL 4.0 PL (MISCELLANEOUS) IMPLANT
MARKER SKIN DUAL TIP RULER LAB (MISCELLANEOUS) IMPLANT
NDL INSUFFLATION 14GA 120MM (NEEDLE) ×2 IMPLANT
NEEDLE INSUFFLATION 14GA 120MM (NEEDLE) ×1
OBTURATOR OPTICAL STND 8 DVNC (TROCAR) ×1
OBTURATOR OPTICALSTD 8 DVNC (TROCAR) ×2 IMPLANT
OCCLUDER COLPOPNEUMO (BALLOONS) ×2 IMPLANT
PACK ROBOT WH (CUSTOM PROCEDURE TRAY) ×2 IMPLANT
PACK ROBOTIC GOWN (GOWN DISPOSABLE) ×2 IMPLANT
PAD OB MATERNITY 4.3X12.25 (PERSONAL CARE ITEMS) ×2 IMPLANT
PAD PREP 24X48 CUFFED NSTRL (MISCELLANEOUS) ×2 IMPLANT
PROTECTOR NERVE ULNAR (MISCELLANEOUS) ×2 IMPLANT
RETRACTOR WND ALEXIS 18 MED (MISCELLANEOUS) IMPLANT
RTRCTR WOUND ALEXIS 18CM MED (MISCELLANEOUS)
RTRCTR WOUND ALEXIS 18CM SML (INSTRUMENTS)
SAVER CELL AAL HAEMONETICS (INSTRUMENTS) IMPLANT
SCISSORS MNPLR CVD DVNC XI (INSTRUMENTS) ×2 IMPLANT
SEAL UNIV 5-12 XI (MISCELLANEOUS) ×4 IMPLANT
SEALER VESSEL EXT DVNC XI (MISCELLANEOUS) IMPLANT
SET IRRIG Y TYPE TUR BLADDER L (SET/KITS/TRAYS/PACK) IMPLANT
SET TUBE FILTERED XL AIRSEAL (SET/KITS/TRAYS/PACK) ×2 IMPLANT
SLEEVE SCD COMPRESS KNEE MED (STOCKING) ×2 IMPLANT
SPIKE FLUID TRANSFER (MISCELLANEOUS) ×4 IMPLANT
SUT VIC AB 0 CT1 27 (SUTURE)
SUT VIC AB 0 CT1 27XBRD ANBCTR (SUTURE) IMPLANT
SUT VIC AB 4-0 PS2 18 (SUTURE) IMPLANT
SUT VICRYL 0 UR6 27IN ABS (SUTURE) IMPLANT
SUT VLOC 180 0 9IN GS21 (SUTURE) ×2 IMPLANT
TIP RUMI ORANGE 6.7MMX12CM (TIP) IMPLANT
TIP UTERINE 5.1X6CM LAV DISP (MISCELLANEOUS) IMPLANT
TIP UTERINE 6.7X10CM GRN DISP (MISCELLANEOUS) IMPLANT
TIP UTERINE 6.7X6CM WHT DISP (MISCELLANEOUS) IMPLANT
TIP UTERINE 6.7X8CM BLUE DISP (MISCELLANEOUS) IMPLANT
TOWEL OR 17X24 6PK STRL BLUE (TOWEL DISPOSABLE) ×2 IMPLANT
TRAY FOLEY MTR SLVR 14FR STAT (SET/KITS/TRAYS/PACK) IMPLANT
TROCAR PORT AIRSEAL 8X100 (TROCAR) IMPLANT
WATER STERILE IRR 1000ML POUR (IV SOLUTION) ×2 IMPLANT
WATER STERILE IRR 500ML POUR (IV SOLUTION) IMPLANT

## 2023-08-18 NOTE — Anesthesia Preprocedure Evaluation (Signed)
Anesthesia Evaluation  Patient identified by MRN, date of birth, ID band Patient awake    Reviewed: Allergy & Precautions, NPO status , Patient's Chart, lab work & pertinent test results  Airway Mallampati: II       Dental no notable dental hx. (+) Teeth Intact, Dental Advisory Given   Pulmonary neg pulmonary ROS   Pulmonary exam normal breath sounds clear to auscultation       Cardiovascular negative cardio ROS Normal cardiovascular exam     Neuro/Psych  PSYCHIATRIC DISORDERS Anxiety     negative neurological ROS     GI/Hepatic negative GI ROS, Neg liver ROS,,,  Endo/Other  negative endocrine ROS    Renal/GU negative Renal ROS  negative genitourinary   Musculoskeletal  (+) Arthritis , Osteoarthritis,    Abdominal Normal abdominal exam  (+)   Peds  Hematology negative hematology ROS (+)   Anesthesia Other Findings   Reproductive/Obstetrics                             Anesthesia Physical Anesthesia Plan  ASA: 2  Anesthesia Plan: General   Post-op Pain Management:    Induction: Intravenous  PONV Risk Score and Plan: 4 or greater and Midazolam, Dexamethasone and Ondansetron  Airway Management Planned: Oral ETT  Additional Equipment: None  Intra-op Plan:   Post-operative Plan: Extubation in OR  Informed Consent: I have reviewed the patients History and Physical, chart, labs and discussed the procedure including the risks, benefits and alternatives for the proposed anesthesia with the patient or authorized representative who has indicated his/her understanding and acceptance.     Dental advisory given  Plan Discussed with: CRNA  Anesthesia Plan Comments:        Anesthesia Quick Evaluation

## 2023-08-18 NOTE — Discharge Instructions (Signed)
   No acetaminophen/Tylenol until after 6:10 pm today if needed.  No ibuprofen, Advil, Aleve, Motrin, ketorolac, meloxicam, naproxen, or other NSAIDS until after 8:00 pm today if needed.     Post Anesthesia Home Care Instructions  Activity: Get plenty of rest for the remainder of the day. A responsible individual must stay with you for 24 hours following the procedure.  For the next 24 hours, DO NOT: -Drive a car -Advertising copywriter -Drink alcoholic beverages -Take any medication unless instructed by your physician -Make any legal decisions or sign important papers.  Meals: Start with liquid foods such as gelatin or soup. Progress to regular foods as tolerated. Avoid greasy, spicy, heavy foods. If nausea and/or vomiting occur, drink only clear liquids until the nausea and/or vomiting subsides. Call your physician if vomiting continues.  Special Instructions/Symptoms: Your throat may feel dry or sore from the anesthesia or the breathing tube placed in your throat during surgery. If this causes discomfort, gargle with warm salt water. The discomfort should disappear within 24 hours.

## 2023-08-18 NOTE — Anesthesia Procedure Notes (Signed)
Procedure Name: Intubation Date/Time: 08/18/2023 1:28 PM  Performed by: Briant Sites, CRNAPre-anesthesia Checklist: Patient identified, Emergency Drugs available, Suction available and Patient being monitored Patient Re-evaluated:Patient Re-evaluated prior to induction Oxygen Delivery Method: Circle system utilized Preoxygenation: Pre-oxygenation with 100% oxygen Induction Type: IV induction Ventilation: Mask ventilation without difficulty Laryngoscope Size: Mac and 3 Grade View: Grade I Tube type: Oral Tube size: 7.0 mm Number of attempts: 1 Airway Equipment and Method: Stylet Placement Confirmation: ETT inserted through vocal cords under direct vision, positive ETCO2 and breath sounds checked- equal and bilateral Secured at: 20 cm Tube secured with: Tape Dental Injury: Teeth and Oropharynx as per pre-operative assessment

## 2023-08-18 NOTE — Op Note (Signed)
NAMEDAPHNEY, SANZARI MEDICAL RECORD NO: [id number] ACCOUNT NO: [id number] DATE OF BIRTH: 05-19-1981 FACILITY: [facility] LOCATION: [facility] PHYSICIAN: Sherian Rein, MD  Operative Report   DATE OF PROCEDURE: 08/18/2023  PREOPERATIVE DIAGNOSIS:  Family history of ovarian cancer.  POSTOPERATIVE DIAGNOSIS:  Family history of ovarian cancer, status post bilateral salpingectomy.  PROCEDURE:  da Vinci Robot-assisted bilateral salpingectomy.  SURGEON:  Sherian Rein, MD  ASSISTANT:  Herma Mering, RNFA  ANESTHESIA:  Local and general.  ESTIMATED BLOOD LOSS:  5 mL.  IV FLUIDS: Per anesthesia.  URINE OUTPUT:  200 mL clear urine output throughout the procedure.  COMPLICATIONS:  None.  PATHOLOGY: Bilateral fallopian tubes, right marked with a suture.  DESCRIPTION OF PROCEDURE:  After informed consent was reviewed with the patient including risks, benefits and alternatives of the surgical procedure she was transported to the operating room, placed on table in supine position.  After an appropriate  timeout was performed.  General anesthesia had been induced.  She was prepped and draped in the normal sterile fashion.  Foley catheter was placed in her bladder.  A Hulka manipulator was placed through her cervix into her uterus.  Gloves and gown were  changed.  Attention was turned to the abdominal portion of the case.  Approximately three 8 mm incisions were made above her umbilicus and then on the right and left.  Pneumoperitoneum was obtained through the Veress needle after passing the hanging drop  test with an opening pressure of 0 mmHg.  The umbilical port was placed under direct visualization.  Accessory ports were placed in the right and left under direct visualization.  Brief pelvic survey revealed normal uterus, normal tubes and ovaries with  a simple-appearing cyst on the left ovary.  The fenestrated bipolar was used to grasp the left tube with the fimbriated end and scissors  were used to excise the tube to the level of the cornua and it was removed, placed in the cul-de-sac.  This was done  on the right side tube was also removed and placed in the cul-de-sac.  The hemostasis was assured.  The tubes were removed and the Hulka manipulator was also removed.  The patient tolerated the procedure well.  Sponge, lap and needle counts were correct  x2 per the operating room staff.   PUS D: 08/18/2023 2:30:04 pm T: 08/18/2023 4:22:00 pm  JOB: [id number]/ [id number]

## 2023-08-18 NOTE — Transfer of Care (Signed)
Immediate Anesthesia Transfer of Care Note  Patient: Elizabeth Rose  Procedure(s) Performed: XI ROBOTIC ASSISTED SALPINGECTOMY (Bilateral: Pelvis)  Patient Location: PACU  Anesthesia Type:General  Level of Consciousness: drowsy  Airway & Oxygen Therapy: Patient Spontanous Breathing and Patient connected to nasal cannula oxygen  Post-op Assessment: Report given to RN and Post -op Vital signs reviewed and stable  Post vital signs: Reviewed and stable  Last Vitals:  Vitals Value Taken Time  BP 110/65   Temp 97.8   Pulse 63   Resp 13   SpO2 100     Last Pain:  Vitals:   08/18/23 1155  TempSrc: Oral         Complications: No notable events documented.

## 2023-08-18 NOTE — Brief Op Note (Signed)
08/18/2023  2:21 PM  PATIENT:  Elizabeth Rose  42 y.o. female  PRE-OPERATIVE DIAGNOSIS:  family history of malignant neoplasm of ovary  POST-OPERATIVE DIAGNOSIS:  family history of malignant neoplasm of ovary  PROCEDURE:  Procedure(s): XI ROBOTIC ASSISTED SALPINGECTOMY (Bilateral)  SURGEON:  Surgeons and Role:    * Bovard-Stuckert, Augusto Gamble, MD - Primary  ASSISTANTS: Karmen Stabs RNFA   ANESTHESIA:   local and general  EBL:  5 mL IVF per anesthesia, 200cc uop  DRAINS: none   LOCAL MEDICATIONS USED:  MARCAINE     SPECIMEN:  Source of Specimen:  B fallopian tubes  DISPOSITION OF SPECIMEN:  PATHOLOGY  COUNTS:  YES  DICTATION: .Other Dictation: Dictation Number [id number]  PLAN OF CARE: Discharge to home after PACU  PATIENT DISPOSITION:  PACU - hemodynamically stable.   Delay start of Pharmacological VTE agent (>24hrs) due to surgical blood loss or risk of bleeding: not applicable

## 2023-08-18 NOTE — Interval H&P Note (Signed)
History and Physical Interval Note:  08/18/2023 1:00 PM  Elizabeth Rose  has presented today for surgery, with the diagnosis of family history of malignant neoplasm of ovary.  The various methods of treatment have been discussed with the patient and family. After consideration of risks, benefits and other options for treatment, the patient has consented to  Procedure(s): XI ROBOTIC ASSISTED SALPINGECTOMY (Bilateral) as a surgical intervention.  The patient's history has been reviewed, patient examined, no change in status, stable for surgery.  I have reviewed the patient's chart and labs.  Questions were answered to the patient's satisfaction.     Shelie Lansing Bovard-Stuckert

## 2023-08-19 NOTE — Anesthesia Postprocedure Evaluation (Signed)
Anesthesia Post Note  Patient: Elizabeth Rose  Procedure(s) Performed: XI ROBOTIC ASSISTED SALPINGECTOMY (Bilateral: Pelvis)     Patient location during evaluation: PACU Anesthesia Type: General Level of consciousness: awake Pain management: pain level controlled Vital Signs Assessment: post-procedure vital signs reviewed and stable Respiratory status: spontaneous breathing Cardiovascular status: stable Postop Assessment: no headache Anesthetic complications: yes (PONV)  No notable events documented.  Last Vitals:  Vitals:   08/18/23 1515 08/18/23 1550  BP: 106/64 (!) 100/56  Pulse: (!) 54 (!) 52  Resp: 10 18  Temp: (!) 36.4 C   SpO2: 95% 97%    Last Pain:  Vitals:   08/18/23 1550  TempSrc:   PainSc: 3                  [employer]

## 2023-08-20 ENCOUNTER — Encounter (HOSPITAL_BASED_OUTPATIENT_CLINIC_OR_DEPARTMENT_OTHER): Payer: Self-pay | Admitting: Obstetrics and Gynecology

## 2023-08-20 LAB — SURGICAL PATHOLOGY

## 2023-10-03 ENCOUNTER — Encounter (HOSPITAL_BASED_OUTPATIENT_CLINIC_OR_DEPARTMENT_OTHER): Payer: BC Managed Care – PPO | Admitting: Orthopaedic Surgery

## 2023-10-09 ENCOUNTER — Encounter (HOSPITAL_BASED_OUTPATIENT_CLINIC_OR_DEPARTMENT_OTHER): Payer: BC Managed Care – PPO | Admitting: Orthopaedic Surgery

## 2023-10-10 ENCOUNTER — Ambulatory Visit (HOSPITAL_BASED_OUTPATIENT_CLINIC_OR_DEPARTMENT_OTHER): Payer: BC Managed Care – PPO | Admitting: Orthopaedic Surgery

## 2023-10-10 ENCOUNTER — Telehealth (HOSPITAL_BASED_OUTPATIENT_CLINIC_OR_DEPARTMENT_OTHER): Payer: Self-pay | Admitting: Orthopaedic Surgery

## 2023-10-10 DIAGNOSIS — S73191A Other sprain of right hip, initial encounter: Secondary | ICD-10-CM

## 2023-10-10 NOTE — Progress Notes (Signed)
Post Operative Evaluation    Procedure/Date of Surgery: Status post right hip arthroscopy 6/4  Interval History:    Presents today for follow-up status post the above procedure.  Overall she is continuing to improve.  She does frequently have days that are better as opposed to sore days.  This is fluctuating with the weather. PMH/PSH/Family History/Social History/Meds/Allergies:    Past Medical History:  Diagnosis Date   Family history of ovarian cancer    Past Surgical History:  Procedure Laterality Date   CHOLECYSTECTOMY  2000   HIP ARTHROSCOPY W/ LABRAL REPAIR Right 04/01/2023   @[facility]   by dr Huel Cote   HYSTEROSCOPY WITH NOVASURE  08/10/2010   @[facility]  by dr Nino Parsley;   D&C Hysteroscopy w/ Endometrial Ablation w/ Novasure   XI ROBOTIC ASSISTED SALPINGECTOMY Bilateral 08/18/2023   Procedure: XI ROBOTIC ASSISTED SALPINGECTOMY;  Surgeon: Sherian Rein, MD;  Location: [facility];  Service: Gynecology;  Laterality: Bilateral;   Social History   Socioeconomic History   Marital status: Married    Spouse name: Not on file   Number of children: 2   Years of education: Not on file   Highest education level: Not on file  Occupational History   Not on file  Tobacco Use   Smoking status: Never   Smokeless tobacco: Never  Vaping Use   Vaping status: Never Used  Substance and Sexual Activity   Alcohol use: Yes    Alcohol/week: 1.0 standard drink of alcohol    Types: 1 Glasses of wine per week    Comment: Socially   Drug use: Never   Sexual activity: Not on file  Other Topics Concern   Not on file  Social History Narrative   Not on file   Social Drivers of Health   Financial Resource Strain: Not on file  Food Insecurity: Not on file  Transportation Needs: Not on file  Physical Activity: Not on file  Stress: Not on file  Social Connections: Not on file   Family History  Problem Relation Age of Onset    Ovarian cancer Mother    Heart failure Father    No Known Allergies Current Outpatient Medications  Medication Sig Dispense Refill   betamethasone dipropionate 0.05 % cream Apply 1 Application topically as needed.     buPROPion (WELLBUTRIN XL) 300 MG 24 hr tablet TAKE 1 TABLET BY MOUTH EVERY DAY (Patient taking differently: Take 150 mg by mouth daily.) 90 tablet 1   Glycerin-Hypromellose-PEG 400 (DRY EYE RELIEF DROPS OP) Place 1 drop into both eyes daily as needed (Dry eye).     ibuprofen (ADVIL) 800 MG tablet Take 800 mg by mouth every 8 (eight) hours as needed.     Multiple Vitamins-Minerals (MULTIVITAMIN WITH MINERALS) tablet Take 1 tablet by mouth daily.     oxyCODONE-acetaminophen (PERCOCET/ROXICET) 5-325 MG tablet Take 1 tablet by mouth every 6 (six) hours as needed for severe pain (pain score 7-10).     No current facility-administered medications for this visit.   No results found.  Review of Systems:   A ROS was performed including pertinent positives and negatives as documented in the HPI.   Musculoskeletal Exam:    There were no vitals taken for this visit.  Right hip with 30 degrees internal/external rotation without pain.  There  is some hip abduction weakness.  Sensation is intact distally.  No palpable cords.  No swelling of the right calf.  Negative Homan  Imaging:      I personally reviewed and interpreted the radiographs.   Assessment:   Status post right hip arthroscopic labral repair overall doing extremely well.  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  That being said she is continuing to improve from an instability perspective.  Given this I would like her to continue to progress over the course of the next 3 months.  Will plan to see her back in March.  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  I did discuss that we  will plan to potentially consider this more in the future although I would like her to progress with regard to her hip arthroscopy  Plan :    -Return to clinic in 12 weeks      I personally saw and evaluated the patient, and participated in the management and treatment plan.  Huel Cote, MD Attending Physician, Orthopedic Surgery  This document was dictated using Dragon voice recognition software. A reasonable attempt at proof reading has been made to minimize errors.

## 2023-11-14 ENCOUNTER — Ambulatory Visit (INDEPENDENT_AMBULATORY_CARE_PROVIDER_SITE_OTHER): Payer: BC Managed Care – PPO | Admitting: Internal Medicine

## 2023-11-14 ENCOUNTER — Encounter: Payer: Self-pay | Admitting: Internal Medicine

## 2023-11-14 VITALS — BP 100/80 | HR 47 | Temp 98.3°F | Wt 162.0 lb

## 2023-11-14 DIAGNOSIS — Z23 Encounter for immunization: Secondary | ICD-10-CM | POA: Diagnosis not present

## 2023-11-14 DIAGNOSIS — Z0001 Encounter for general adult medical examination with abnormal findings: Secondary | ICD-10-CM

## 2023-11-14 DIAGNOSIS — Z1211 Encounter for screening for malignant neoplasm of colon: Secondary | ICD-10-CM

## 2023-11-14 DIAGNOSIS — Z0000 Encounter for general adult medical examination without abnormal findings: Secondary | ICD-10-CM

## 2023-11-14 NOTE — Progress Notes (Signed)
   Subjective:   Patient ID: Elizabeth Rose, female    DOB: 07-Jun-1981, 43 y.o.   MRN: [id number]  HPI The patient is here for physical.  PMH, [facility], social history reviewed and updated  Review of Systems  Constitutional: Negative.   HENT: Negative.    Eyes: Negative.   Respiratory:  Negative for cough, chest tightness and shortness of breath.   Cardiovascular:  Negative for chest pain, palpitations and leg swelling.  Gastrointestinal:  Negative for abdominal distention, abdominal pain, constipation, diarrhea, nausea and vomiting.  Musculoskeletal: Negative.   Skin: Negative.   Neurological: Negative.   Psychiatric/Behavioral: Negative.      Objective:  Physical Exam Constitutional:      Appearance: She is well-developed.  HENT:     Head: Normocephalic and atraumatic.  Cardiovascular:     Rate and Rhythm: Normal rate and regular rhythm.  Pulmonary:     Effort: Pulmonary effort is normal. No respiratory distress.     Breath sounds: Normal breath sounds. No wheezing or rales.  Abdominal:     General: Bowel sounds are normal. There is no distension.     Palpations: Abdomen is soft.     Tenderness: There is no abdominal tenderness. There is no rebound.  Musculoskeletal:     Cervical back: Normal range of motion.  Skin:    General: Skin is warm and dry.  Neurological:     Mental Status: She is alert and oriented to person, place, and time.     Coordination: Coordination normal.     Vitals:   11/14/23 0811  BP: 100/80  Pulse: (!) 47  Temp: 98.3 F (36.8 C)  TempSrc: Oral  SpO2: 98%  Weight: 162 lb (73.5 kg)    Assessment & Plan:  Tdap given

## 2023-11-14 NOTE — Assessment & Plan Note (Signed)
Flu shot yearly. Tetanus given. Colonoscopy referral done 89 on birthday. Mammogram up to date, pap smear up to date. Counseled about sun safety and mole surveillance. Counseled about the dangers of distracted driving. Given 10 year screening recommendations.

## 2023-11-21 ENCOUNTER — Encounter: Payer: Self-pay | Admitting: Internal Medicine

## 2023-12-05 ENCOUNTER — Ambulatory Visit (AMBULATORY_SURGERY_CENTER): Payer: BC Managed Care – PPO

## 2023-12-05 VITALS — Ht 61.0 in | Wt 165.0 lb

## 2023-12-05 DIAGNOSIS — Z1211 Encounter for screening for malignant neoplasm of colon: Secondary | ICD-10-CM

## 2023-12-05 MED ORDER — SUFLAVE 178.7 G PO SOLR: 1.0000 | Freq: Once | ORAL | 0 refills | Status: AC

## 2023-12-05 MED ORDER — SUFLAVE 178.7 G PO SOLR: 1.0000 | Freq: Once | ORAL | 0 refills | Status: DC

## 2023-12-05 NOTE — Progress Notes (Signed)
 No egg or soy allergy known to patient  No issues known to pt with past sedation with any surgeries or procedures Patient denies ever being told they had issues or difficulty with intubation  No FH of Malignant Hyperthermia Pt is not on diet pills Pt is not on  home 02  Pt is not on blood thinner Pt denies use of chewing tobacco Discussed diabetic I weight loss medication holds Discussed NSAID holds Checked BMI Pt instructed to use Singlecare.com or GoodRx for a price reduction on prep  Patient's chart reviewed by Norleen Schillings CNRA prior to previsit and patient appropriate for the [facility].  Pre visit completed and red dot placed by patient's name on their procedure day (on provider's schedule).

## 2023-12-26 ENCOUNTER — Telehealth: Payer: Self-pay | Admitting: Internal Medicine

## 2023-12-31 ENCOUNTER — Encounter: Payer: Self-pay | Admitting: Internal Medicine

## 2024-01-02 ENCOUNTER — Encounter: Payer: BC Managed Care – PPO | Admitting: Internal Medicine

## 2024-01-02 ENCOUNTER — Encounter: Payer: Self-pay | Admitting: Internal Medicine

## 2024-01-02 ENCOUNTER — Ambulatory Visit: Payer: BC Managed Care – PPO | Admitting: Internal Medicine

## 2024-01-02 VITALS — BP 99/53 | HR 50 | Temp 97.7°F | Resp 14 | Ht 61.0 in | Wt 165.0 lb

## 2024-01-02 DIAGNOSIS — Z1211 Encounter for screening for malignant neoplasm of colon: Secondary | ICD-10-CM

## 2024-01-02 MED ORDER — SODIUM CHLORIDE 0.9 % IV SOLN: 500.0000 mL | Freq: Once | INTRAVENOUS | Status: DC

## 2024-01-02 NOTE — Progress Notes (Signed)
 Sedate, gd SR, tolerated procedure well, VSS, report to RN

## 2024-01-02 NOTE — Patient Instructions (Signed)
Repeat Colonoscopy in 10 years for screening purposes.   YOU HAD AN ENDOSCOPIC PROCEDURE TODAY AT THE [facility]:   Refer to the procedure report that was given to you for any specific questions about what was found during the examination.  If the procedure report does not answer your questions, please call your gastroenterologist to clarify.  If you requested that your care partner not be given the details of your procedure findings, then the procedure report has been included in a sealed envelope for you to review at your convenience later.  YOU SHOULD EXPECT: Some feelings of bloating in the abdomen. Passage of more gas than usual.  Walking can help get rid of the air that was put into your GI tract during the procedure and reduce the bloating. If you had a lower endoscopy (such as a colonoscopy or flexible sigmoidoscopy) you may notice spotting of blood in your stool or on the toilet paper. If you underwent a bowel prep for your procedure, you may not have a normal bowel movement for a few days.  Please Note:  You might notice some irritation and congestion in your nose or some drainage.  This is from the oxygen used during your procedure.  There is no need for concern and it should clear up in a day or so.  SYMPTOMS TO REPORT IMMEDIATELY:  Following lower endoscopy (colonoscopy or flexible sigmoidoscopy):  Excessive amounts of blood in the stool  Significant tenderness or worsening of abdominal pains  Swelling of the abdomen that is new, acute  Fever of 100F or higher  For urgent or emergent issues, a gastroenterologist can be reached at any hour by calling [phone]. Do not use MyChart messaging for urgent concerns.    DIET:  We do recommend a small meal at first, but then you may proceed to your regular diet.  Drink plenty of fluids but you should avoid alcoholic beverages for 24 hours.  ACTIVITY:  You should plan to take it easy for the rest of today and you should  NOT DRIVE or use heavy machinery until tomorrow (because of the sedation medicines used during the test).    FOLLOW UP: Our staff will call the number listed on your records the next business day following your procedure.  We will call around 7:15- 8:00 am to check on you and address any questions or concerns that you may have regarding the information given to you following your procedure. If we do not reach you, we will leave a message.     If any biopsies were taken you will be contacted by phone or by letter within the next 1-3 weeks.  Please call us at [phone] if you have not heard about the biopsies in 3 weeks.    SIGNATURES/CONFIDENTIALITY: You and/or your care partner have signed paperwork which will be entered into your electronic medical record.  These signatures attest to the fact that that the information above on your After Visit Summary has been reviewed and is understood.  Full responsibility of the confidentiality of this discharge information lies with you and/or your care-partner.  

## 2024-01-02 NOTE — Op Note (Signed)
 [facility] Patient Name: Elizabeth Rose Procedure Date: 01/02/2024 10:44 AM MRN: [id number] Endoscopist: Wilhemina Bonito. Marina Goodell , MD, [id number] Age: 43 Referring MD:  Date of Birth: 02-01-1981 Gender: Female Account #: [id number] Procedure:                Colonoscopy Indications:              Screening for colorectal malignant neoplasm Medicines:                Monitored Anesthesia Care Procedure:                Pre-Anesthesia Assessment:                           - Prior to the procedure, a History and Physical                            was performed, and patient medications and                            allergies were reviewed. The patient's tolerance of                            previous anesthesia was also reviewed. The risks                            and benefits of the procedure and the sedation                            options and risks were discussed with the patient.                            All questions were answered, and informed consent                            was obtained. Prior Anticoagulants: The patient has                            taken no anticoagulant or antiplatelet agents. ASA                            Grade Assessment: I - A normal, healthy patient.                            After reviewing the risks and benefits, the patient                            was deemed in satisfactory condition to undergo the                            procedure.                           After obtaining informed consent, the colonoscope  was passed under direct vision. Throughout the                            procedure, the patient's blood pressure, pulse, and                            oxygen saturations were monitored continuously. The                            CF [id number] #[id number] was introduced through the anus                            and advanced to the the cecum, identified by                            appendiceal orifice and  ileocecal valve. The                            ileocecal valve, appendiceal orifice, and rectum                            were photographed. The quality of the bowel                            preparation was excellent. The colonoscopy was                            performed without difficulty. The patient tolerated                            the procedure well. The bowel preparation used was                            SUPREP via split dose instruction. Scope In: 11:04:12 AM Scope Out: 11:23:39 AM Scope Withdrawal Time: 0 hours 11 minutes 11 seconds  Total Procedure Duration: 0 hours 19 minutes 27 seconds  Findings:                 The entire examined colon appeared normal on direct                            and retroflexion views. Complications:            No immediate complications. Estimated blood loss:                            None. Estimated Blood Loss:     Estimated blood loss: none. Impression:               - The entire examined colon is normal on direct and                            retroflexion views.                           - No  specimens collected. Recommendation:           - Repeat colonoscopy in 10 years for screening                            purposes.                           - Patient has a contact number available for                            emergencies. The signs and symptoms of potential                            delayed complications were discussed with the                            patient. Return to normal activities tomorrow.                            Written discharge instructions were provided to the                            patient.                           - Resume previous diet.                           - Continue present medications. Wilhemina Bonito. Marina Goodell, MD 01/02/2024 11:28:22 AM This report has been signed electronically.

## 2024-01-02 NOTE — Progress Notes (Signed)
 HISTORY OF PRESENT ILLNESS:  Elizabeth Rose is a 43 y.o. female sent for routine screening colonoscopy.  No complaints REVIEW OF SYSTEMS:  All non-GI ROS negative except for  Past Medical History:  Diagnosis Date   Family history of ovarian cancer     Past Surgical History:  Procedure Laterality Date   CHOLECYSTECTOMY  2000   HIP ARTHROSCOPY W/ LABRAL REPAIR Right 04/01/2023   @[facility]   by dr Huel Cote   HYSTEROSCOPY WITH NOVASURE  08/10/2010   @[facility]  by dr Nino Parsley;   D&C Hysteroscopy w/ Endometrial Ablation w/ Novasure   XI ROBOTIC ASSISTED SALPINGECTOMY Bilateral 08/18/2023   Procedure: XI ROBOTIC ASSISTED SALPINGECTOMY;  Surgeon: Sherian Rein, MD;  Location: [facility] [place];  Service: Gynecology;  Laterality: Bilateral;    Social History Elizabeth Rose  reports that she has never smoked. She has never used smokeless tobacco. She reports current alcohol use of about 1.0 standard drink of alcohol per week. She reports that she does not use drugs.  family history includes Colon polyps in her father; Heart failure in her father; Ovarian cancer in her mother.  No Known Allergies     PHYSICAL EXAMINATION: Vital signs: BP (!) 109/56   Pulse (!) 56   Temp 97.7 F (36.5 C)   Ht 5\' 1"  (1.549 m)   Wt 165 lb (74.8 kg)   SpO2 100%   BMI 31.18 kg/m  General: Well-developed, well-nourished, no acute distress HEENT: Sclerae are anicteric, conjunctiva pink. Oral mucosa intact Lungs: Clear Heart: Regular Abdomen: soft, nontender, nondistended, no obvious ascites, no peritoneal signs, normal bowel sounds. No organomegaly. Extremities: No edema Psychiatric: alert and oriented x3. Cooperative     ASSESSMENT:  Colon cancer screening   PLAN:  Screening colonoscopy

## 2024-01-05 ENCOUNTER — Telehealth: Payer: Self-pay

## 2024-01-05 NOTE — Telephone Encounter (Signed)
  Follow up Call-     01/02/2024   10:27 AM  Call back number  Post procedure Call Back phone  # [phone]  Permission to leave phone message Yes     Patient questions:  Do you have a fever, pain , or abdominal swelling? No. Pain Score  0 *  Have you tolerated food without any problems? Yes.    Have you been able to return to your normal activities? Yes.    Do you have any questions about your discharge instructions: Diet   No. Medications  No. Follow up visit  No.  Do you have questions or concerns about your Care? No.  Actions: * If pain score is 4 or above: No action needed, pain <4.

## 2024-01-09 ENCOUNTER — Ambulatory Visit (HOSPITAL_BASED_OUTPATIENT_CLINIC_OR_DEPARTMENT_OTHER): Payer: BC Managed Care – PPO | Admitting: Orthopaedic Surgery

## 2024-02-20 ENCOUNTER — Other Ambulatory Visit (HOSPITAL_COMMUNITY): Payer: Self-pay

## 2024-08-27 ENCOUNTER — Other Ambulatory Visit (HOSPITAL_BASED_OUTPATIENT_CLINIC_OR_DEPARTMENT_OTHER): Payer: Self-pay

## 2024-08-27 MED ORDER — FLUZONE 0.5 ML IM SUSY
0.5000 mL | PREFILLED_SYRINGE | Freq: Once | INTRAMUSCULAR | 0 refills | Status: AC
Start: 2024-08-27 — End: 2024-08-28
  Filled 2024-08-27: qty 0.5, 1d supply, fill #0

## 2024-08-30 ENCOUNTER — Encounter: Payer: Self-pay | Admitting: Radiology

## 2024-11-19 ENCOUNTER — Encounter: Payer: BC Managed Care – PPO | Admitting: Internal Medicine
